# Patient Record
Sex: Male | Born: 1965 | Race: White | Hispanic: No | Marital: Single | Smoking: Current every day smoker
Health system: Southern US, Community
[De-identification: ages and names within clinical notes are randomized; demographics above are authoritative.]

## PROBLEM LIST (undated history)

## (undated) DIAGNOSIS — R42 Dizziness and giddiness: Secondary | ICD-10-CM

## (undated) DIAGNOSIS — Z8659 Personal history of other mental and behavioral disorders: Secondary | ICD-10-CM

## (undated) DIAGNOSIS — R9431 Abnormal electrocardiogram [ECG] [EKG]: Secondary | ICD-10-CM

## (undated) DIAGNOSIS — Z9189 Other specified personal risk factors, not elsewhere classified: Secondary | ICD-10-CM

## (undated) HISTORY — DX: Personal history of other mental and behavioral disorders: Z86.59

## (undated) HISTORY — DX: Other specified personal risk factors, not elsewhere classified: Z91.89

## (undated) HISTORY — DX: Dizziness and giddiness: R42

## (undated) HISTORY — DX: Abnormal electrocardiogram (ECG) (EKG): R94.31

---

## 1999-03-12 ENCOUNTER — Emergency Department (HOSPITAL_COMMUNITY): Admission: EM | Admit: 1999-03-12 | Discharge: 1999-03-12 | Payer: Self-pay

## 1999-03-17 ENCOUNTER — Emergency Department (HOSPITAL_COMMUNITY): Admission: EM | Admit: 1999-03-17 | Discharge: 1999-03-17 | Payer: Self-pay | Admitting: Emergency Medicine

## 1999-11-18 ENCOUNTER — Emergency Department (HOSPITAL_COMMUNITY): Admission: EM | Admit: 1999-11-18 | Discharge: 1999-11-18 | Payer: Self-pay

## 2000-08-29 ENCOUNTER — Emergency Department (HOSPITAL_COMMUNITY): Admission: EM | Admit: 2000-08-29 | Discharge: 2000-08-30 | Payer: Self-pay | Admitting: Emergency Medicine

## 2000-08-30 ENCOUNTER — Encounter: Payer: Self-pay | Admitting: Emergency Medicine

## 2000-10-06 ENCOUNTER — Encounter: Payer: Self-pay | Admitting: Orthopedic Surgery

## 2000-10-06 ENCOUNTER — Ambulatory Visit (HOSPITAL_COMMUNITY): Admission: RE | Admit: 2000-10-06 | Discharge: 2000-10-06 | Payer: Self-pay | Admitting: Orthopedic Surgery

## 2000-11-30 ENCOUNTER — Encounter: Admission: RE | Admit: 2000-11-30 | Discharge: 2000-11-30 | Payer: Self-pay | Admitting: Orthopedic Surgery

## 2001-07-30 ENCOUNTER — Emergency Department (HOSPITAL_COMMUNITY): Admission: EM | Admit: 2001-07-30 | Discharge: 2001-07-30 | Payer: Self-pay | Admitting: Emergency Medicine

## 2002-06-23 ENCOUNTER — Emergency Department (HOSPITAL_COMMUNITY): Admission: EM | Admit: 2002-06-23 | Discharge: 2002-06-23 | Payer: Self-pay | Admitting: Emergency Medicine

## 2004-08-19 ENCOUNTER — Emergency Department (HOSPITAL_COMMUNITY): Admission: EM | Admit: 2004-08-19 | Discharge: 2004-08-19 | Payer: Self-pay | Admitting: Emergency Medicine

## 2004-08-20 ENCOUNTER — Emergency Department (HOSPITAL_COMMUNITY): Admission: EM | Admit: 2004-08-20 | Discharge: 2004-08-20 | Payer: Self-pay | Admitting: Emergency Medicine

## 2004-08-26 ENCOUNTER — Emergency Department (HOSPITAL_COMMUNITY): Admission: EM | Admit: 2004-08-26 | Discharge: 2004-08-26 | Payer: Self-pay | Admitting: Emergency Medicine

## 2004-09-03 ENCOUNTER — Ambulatory Visit: Payer: Self-pay | Admitting: Internal Medicine

## 2004-09-08 ENCOUNTER — Ambulatory Visit: Payer: Self-pay | Admitting: Internal Medicine

## 2004-09-08 DIAGNOSIS — Z9189 Other specified personal risk factors, not elsewhere classified: Secondary | ICD-10-CM

## 2004-10-07 ENCOUNTER — Ambulatory Visit: Payer: Self-pay | Admitting: Internal Medicine

## 2004-10-10 ENCOUNTER — Encounter: Payer: Self-pay | Admitting: Internal Medicine

## 2004-10-10 LAB — CONVERTED CEMR LAB
ALT: 18 units/L
BUN: 6 mg/dL
CO2: 31 meq/L
Cholesterol: 177 mg/dL
Creatinine, Ser: 1 mg/dL
Glucose, Bld: 76 mg/dL
Hemoglobin: 15.9 g/dL
MCV: 94.7 fL
Potassium: 3.7 meq/L
RBC: 4.98 M/uL
RDW: 12.5 %
Sodium: 142 meq/L
TSH: 1.02 microintl units/mL
Total Bilirubin: 0.8 mg/dL
WBC: 6.3 10*3/uL

## 2005-03-25 ENCOUNTER — Ambulatory Visit: Payer: Self-pay | Admitting: Internal Medicine

## 2005-03-31 ENCOUNTER — Ambulatory Visit: Payer: Self-pay

## 2005-04-14 ENCOUNTER — Ambulatory Visit: Payer: Self-pay | Admitting: Internal Medicine

## 2005-06-15 ENCOUNTER — Ambulatory Visit: Payer: Self-pay | Admitting: Internal Medicine

## 2005-06-15 ENCOUNTER — Ambulatory Visit (HOSPITAL_COMMUNITY): Admission: RE | Admit: 2005-06-15 | Discharge: 2005-06-15 | Payer: Self-pay | Admitting: Internal Medicine

## 2005-07-21 ENCOUNTER — Ambulatory Visit: Payer: Self-pay | Admitting: Internal Medicine

## 2005-08-20 ENCOUNTER — Ambulatory Visit (HOSPITAL_COMMUNITY): Admission: RE | Admit: 2005-08-20 | Discharge: 2005-08-20 | Payer: Self-pay | Admitting: Internal Medicine

## 2005-08-20 ENCOUNTER — Ambulatory Visit: Payer: Self-pay | Admitting: Internal Medicine

## 2005-11-11 ENCOUNTER — Ambulatory Visit: Payer: Self-pay | Admitting: Internal Medicine

## 2005-12-19 ENCOUNTER — Ambulatory Visit: Payer: Self-pay | Admitting: Internal Medicine

## 2005-12-19 DIAGNOSIS — R42 Dizziness and giddiness: Secondary | ICD-10-CM

## 2005-12-21 ENCOUNTER — Ambulatory Visit: Payer: Self-pay | Admitting: Internal Medicine

## 2006-01-05 ENCOUNTER — Ambulatory Visit: Payer: Self-pay

## 2006-01-05 ENCOUNTER — Encounter: Payer: Self-pay | Admitting: Cardiovascular Disease

## 2006-01-06 ENCOUNTER — Ambulatory Visit: Payer: Self-pay | Admitting: Cardiology

## 2009-10-09 ENCOUNTER — Encounter: Payer: Self-pay | Admitting: Internal Medicine

## 2009-10-09 DIAGNOSIS — R9431 Abnormal electrocardiogram [ECG] [EKG]: Secondary | ICD-10-CM

## 2009-10-09 DIAGNOSIS — Z8659 Personal history of other mental and behavioral disorders: Secondary | ICD-10-CM

## 2009-10-10 ENCOUNTER — Ambulatory Visit: Payer: Self-pay | Admitting: Internal Medicine

## 2009-10-29 ENCOUNTER — Ambulatory Visit: Payer: Self-pay | Admitting: Internal Medicine

## 2010-10-26 ENCOUNTER — Encounter: Payer: Self-pay | Admitting: Internal Medicine

## 2011-06-03 ENCOUNTER — Encounter: Payer: Self-pay | Admitting: Internal Medicine

## 2011-06-03 ENCOUNTER — Ambulatory Visit (INDEPENDENT_AMBULATORY_CARE_PROVIDER_SITE_OTHER): Payer: Self-pay | Admitting: Internal Medicine

## 2011-06-03 VITALS — BP 122/74 | HR 88 | Temp 98.2°F | Ht 68.25 in | Wt 184.0 lb

## 2011-06-03 DIAGNOSIS — F8189 Other developmental disorders of scholastic skills: Secondary | ICD-10-CM

## 2011-06-03 DIAGNOSIS — F819 Developmental disorder of scholastic skills, unspecified: Secondary | ICD-10-CM

## 2011-06-03 NOTE — Progress Notes (Signed)
  Subjective:    Patient ID: Francisco Dodson, male    DOB: 1966/02/22, 45 y.o.   MRN: [id number]  HPI  Visit cancelled.  Pt requests to have disability form filled out.   Pt reports his girlfriend was handling his disability payments but she was diagnosed with cancer and living with family member.   He does not recall details of why is he receiving disability payments.  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  It has been at least 10 yrs that he has been disabled.  Pt referred back to mental health re:  Status of disability.  No charge for office visit.  Review of Systems     Objective:   Physical Exam        Assessment & Plan:

## 2012-02-05 ENCOUNTER — Ambulatory Visit (INDEPENDENT_AMBULATORY_CARE_PROVIDER_SITE_OTHER): Payer: Medicare Other | Admitting: Family Medicine

## 2012-02-05 ENCOUNTER — Ambulatory Visit: Payer: Medicare Other

## 2012-02-05 VITALS — BP 136/84 | HR 86 | Temp 97.9°F | Resp 18 | Ht 69.0 in | Wt 191.0 lb

## 2012-02-05 DIAGNOSIS — R079 Chest pain, unspecified: Secondary | ICD-10-CM

## 2012-02-05 DIAGNOSIS — R0602 Shortness of breath: Secondary | ICD-10-CM

## 2012-02-05 DIAGNOSIS — F32A Depression, unspecified: Secondary | ICD-10-CM

## 2012-02-05 DIAGNOSIS — F329 Major depressive disorder, single episode, unspecified: Secondary | ICD-10-CM

## 2012-02-05 DIAGNOSIS — Z634 Disappearance and death of family member: Secondary | ICD-10-CM

## 2012-02-05 DIAGNOSIS — F3289 Other specified depressive episodes: Secondary | ICD-10-CM

## 2012-02-05 LAB — COMPREHENSIVE METABOLIC PANEL
ALT: 29 U/L (ref 0–53)
AST: 18 U/L (ref 0–37)
Albumin: 4.1 g/dL (ref 3.5–5.2)
Alkaline Phosphatase: 45 U/L (ref 39–117)
CO2: 30 mEq/L (ref 19–32)
Glucose, Bld: 71 mg/dL (ref 70–99)
Sodium: 142 mEq/L (ref 135–145)

## 2012-02-05 LAB — TSH: TSH: 1.837 u[IU]/mL (ref 0.350–4.500)

## 2012-02-05 LAB — POCT CBC
Granulocyte percent: 55.6 %G (ref 37–80)
HCT, POC: 53 % (ref 43.5–53.7)
Hemoglobin: 17.3 g/dL (ref 14.1–18.1)
Lymph, poc: 3.4 (ref 0.6–3.4)
MCH, POC: 30.6 pg (ref 27–31.2)
MCHC: 32.6 g/dL (ref 31.8–35.4)
MCV: 93.8 fL (ref 80–97)
MID (cbc): 0.7 (ref 0–0.9)
MPV: 8.8 fL (ref 0–99.8)
POC Granulocyte: 5.2 (ref 2–6.9)
POC LYMPH PERCENT: 36.6 %L (ref 10–50)
POC MID %: 7.8 % (ref 0–12)
Platelet Count, POC: 258 10*3/uL (ref 142–424)
RBC: 5.65 M/uL (ref 4.69–6.13)
RDW, POC: 14 %
WBC: 9.3 10*3/uL (ref 4.6–10.2)

## 2012-02-05 LAB — COMPREHENSIVE METABOLIC PANEL WITH GFR
BUN: 12 mg/dL (ref 6–23)
Calcium: 9.3 mg/dL (ref 8.4–10.5)
Chloride: 106 meq/L (ref 96–112)
Creat: 0.97 mg/dL (ref 0.50–1.35)
Potassium: 4.7 meq/L (ref 3.5–5.3)
Total Bilirubin: 0.4 mg/dL (ref 0.3–1.2)
Total Protein: 6.2 g/dL (ref 6.0–8.3)

## 2012-02-05 LAB — LDL CHOLESTEROL, DIRECT: Direct LDL: 153 mg/dL — ABNORMAL HIGH

## 2012-02-05 LAB — TROPONIN I: Troponin I: <0.01 ng/mL (ref <0.06)

## 2012-02-05 NOTE — Patient Instructions (Signed)

## 2012-02-05 NOTE — Progress Notes (Signed)
[facility]:  Office Visit  Chief Complaint:  Chief Complaint  Patient presents with  . Chest Pain  . Shortness of Breath  . very stressed lately    HPI: Francisco Dodson is a 46 y.o. male who complains of:  1.  intermittent localized SOB and midsubsternal CP x 1 week, triggered by certain foods/large meals.  Lasts all day or all night. Nothing makes it feel better. Has not tried anything for it. His main issues is he feels "uncomfortable, sore, feels like indigestion". Intermittent, not triggered by activity. Likes to eat "4 fish sandwiches, fries" about 1-2 hrs before he goes to bed. He declines CP but states he has just midsternal "discomfort" after eating and drinking. Denies abdominal pain. Denies HA, vision changes, nausea, jaw pain, new shoulder or arm pain, vomiting, abdominal pain, dizziness, diaphoresis, palpitations, weakness, slurred speech, fatigue, confusion. Consumes large amounts of caffeine and smokes 1-1.5 ppd x 10 years. Denies h/o heartburn, alcohol consumption, illicit drug use. Denies history of  HTN, T2DM, Hyperlipidemia.   Risk factors for ischemia:  strong family history of MI ( father had MI at age 69, has had stents; brother had a stroke and MI, mother died from an MI at age 74); tobacco use.  2.  He is extremely stressed and depressed. Has poor support system . Tried calling his father to discuss his issues but was brushed off and felt his dad is very "cruel". He does not have an outlet to discuss his wife's recent death. His wife died from throat and rectal cancer 1 month ago ( 1 month anniversary will be in 2 days from today). He had a stress test by his internist ( Dr. Artist Pais) in 2006 which showed no ischemia ( in epic). Denies any thoughts of HI/SI/hallucinations.     History reviewed. No pertinent past medical history. History reviewed. No pertinent past surgical history. History   Social History  . Marital Status: Single    Spouse Name: N/A   Number of Children: N/A  . Years of Education: N/A   Social History Main Topics  . Smoking status: Current Everyday Smoker -- 1.0 packs/day for 10 years  . Smokeless tobacco: None  . Alcohol Use: No  . Drug Use: No  . Sexually Active: None   Other Topics Concern  . None   Social History Narrative  . None   Family History  Problem Relation Age of Onset  . Heart attack Mother   . Heart disease Mother   . Heart attack Father   . Heart disease Father   . Heart disease Brother   . Stroke Brother   . Heart attack Brother     CABG x3   Allergies  Allergen Reactions  . Penicillins    Prior to Admission medications   Not on File    ROS: The patient denies fevers, chills, night sweats, unintentional weight loss, palpitations, wheezing, dyspnea on exertion, nausea, vomiting, abdominal pain, dysuria, hematuria, melena, numbness, weakness, or tingling. + chest discomfort.  All other systems have been reviewed and were otherwise negative with the exception of those mentioned in the HPI and as above.    PHYSICAL EXAM: Filed Vitals:   02/05/12 1557  BP: 136/84  Pulse:   Temp:   Resp:   Spo2     98%  Filed Vitals:   02/05/12 1455  Height: 5\' 9"  (1.753 m)  Weight: 191 lb (86.637 kg)   Body mass index is 28.21 kg/(m^2).  General: Alert, no acute distress, slightly anxious, tangential HEENT:  Normocephalic, atraumatic, oropharynx patent. Fundoscopic exam normal, EOMI, PERRLA, Tm nl. Cardiovascular:  Regular rate and rhythm, no rubs murmurs or gallops.  No Carotid bruits, radial pulse intact. No pedal edema.  Respiratory: Clear to auscultation bilaterally.  No wheezes, rales, or rhonchi.  No cyanosis, no use of accessory musculature GI: No organomegaly, abdomen is soft and non-tender, positive bowel sounds.  No masses. Skin: No rashes. Neurologic: Facial musculature symmetric. CN 2-12 grossly normal. No slurred speech, confusion. Psychiatric: Patient is appropriate throughout  our interaction. Slightly tangential but otherwise ok.  Lymphatic: No cervical lymphadenopathy, no thyroidmegaly. Musculoskeletal: Gait intact.   LABS: Results for orders placed in visit on 02/05/12  POCT CBC      Component Value Range   WBC 9.3  4.6 - 10.2 (K/uL)   Lymph, poc 3.4  0.6 - 3.4    POC LYMPH PERCENT 36.6  10 - 50 (%L)   MID (cbc) 0.7  0 - 0.9    POC MID % 7.8  0 - 12 (%M)   POC Granulocyte 5.2  2 - 6.9    Granulocyte percent 55.6  37 - 80 (%G)   RBC 5.65  4.69 - 6.13 (M/uL)   Hemoglobin 17.3  14.1 - 18.1 (g/dL)   HCT, POC 16.1  09.6 - 53.7 (%)   MCV 93.8  80 - 97 (fL)   MCH, POC 30.6  27 - 31.2 (pg)   MCHC 32.6  31.8 - 35.4 (g/dL)   RDW, POC 04.5     Platelet Count, POC 258  142 - 424 (K/uL)   MPV 8.8  0 - 99.8 (fL)     EKG/XRAY:   Primary read interpreted by Dr. Conley Rolls at [facility]. EKG-NSR at 74 bpm, no ST-T wave  Depression/elevation CXR-no pneumonthorax, no infiltrate   ASSESSMENT/PLAN: Encounter Diagnoses  Name Primary?  . Chest pain at rest Yes  . Bereavement   . Depression (emotion)    1. CP-etiology likely reflux/esophageal spasm in origin vs cardiovascular vs stress vs less likely pulmonary. He has GERD sxs based on hx, however due to strong family h/o MI/heart disease I will refer him to cardiology to be seen next week. Will get stat troponin x 1. Call report to be sent to me. Rx trial of Prilosec 20 mg daily. Tobacco cessation and GERD precautions given. Patient advise to go to ER if sxs change/worsen.   2. Depression?Bereavement?-need to be reevaluated for depression/bereavement after cardiac evaluation. Patient recently lost his wife 1 month ago from throat and rectal cancer. He is anxious that the house he is living in  which was his wife's house and where both she and her mother died from cancer is causing him to feel ill. He states that it is also the "reason why I am feeling sick".  Recommend f/u with PCP and/or with [place] after cardiology evaluation. He  is planning to get the house inspected on Monday.   Hamilton Capri PHUONG, DO 02/05/2012 4:33 PM

## 2012-02-06 ENCOUNTER — Telehealth: Payer: Self-pay | Admitting: Family Medicine

## 2012-02-06 NOTE — Telephone Encounter (Signed)
Spoke to patietn about labs. His LDL was elevated but he had already eaten, so recommended that he get a repeat fasting lipid with cardiologistand/or [place] on next appt. He will get a referral to cardiology, if he does not hear from [place] by next week midweek then need to call us back. Patient feeling ok , CP free.

## 2012-02-12 ENCOUNTER — Encounter: Payer: Self-pay | Admitting: *Deleted

## 2012-02-15 ENCOUNTER — Institutional Professional Consult (permissible substitution): Payer: Medicare Other | Admitting: Cardiovascular Disease

## 2012-03-31 ENCOUNTER — Emergency Department (HOSPITAL_COMMUNITY)
Admission: EM | Admit: 2012-03-31 | Discharge: 2012-04-01 | Discharge status: Against Medical Advice | Payer: Medicare Other | Attending: Emergency Medicine | Admitting: Emergency Medicine

## 2012-03-31 ENCOUNTER — Encounter (HOSPITAL_COMMUNITY): Payer: Self-pay | Admitting: Emergency Medicine

## 2012-03-31 DIAGNOSIS — R21 Rash and other nonspecific skin eruption: Secondary | ICD-10-CM | POA: Insufficient documentation

## 2012-03-31 NOTE — ED Notes (Signed)
Per pt, states he picked at a bump on right leg and caused a sore, noticed he has broken out with more blisters-

## 2012-03-31 NOTE — ED Notes (Signed)
Pt. Called to be roomed.  No answer.

## 2012-04-01 ENCOUNTER — Ambulatory Visit (INDEPENDENT_AMBULATORY_CARE_PROVIDER_SITE_OTHER): Payer: Medicare Other | Admitting: Emergency Medicine

## 2012-04-01 VITALS — BP 135/76 | HR 67 | Temp 97.9°F | Resp 16 | Ht 68.0 in | Wt 186.4 lb

## 2012-04-01 DIAGNOSIS — L738 Other specified follicular disorders: Secondary | ICD-10-CM

## 2012-04-01 DIAGNOSIS — L97919 Non-pressure chronic ulcer of unspecified part of right lower leg with unspecified severity: Secondary | ICD-10-CM

## 2012-04-01 DIAGNOSIS — S81809A Unspecified open wound, unspecified lower leg, initial encounter: Secondary | ICD-10-CM

## 2012-04-01 DIAGNOSIS — L739 Follicular disorder, unspecified: Secondary | ICD-10-CM

## 2012-04-01 DIAGNOSIS — L97909 Non-pressure chronic ulcer of unspecified part of unspecified lower leg with unspecified severity: Secondary | ICD-10-CM

## 2012-04-01 MED ORDER — MUPIROCIN CALCIUM 2 % NA OINT: TOPICAL_OINTMENT | Freq: Two times a day (BID) | NASAL | Status: DC

## 2012-04-01 MED ORDER — DOXYCYCLINE HYCLATE 100 MG PO CAPS: 100.0000 mg | ORAL_CAPSULE | Freq: Two times a day (BID) | ORAL | Status: AC

## 2012-04-01 MED ORDER — MUPIROCIN CALCIUM 2 % EX CREA: TOPICAL_CREAM | Freq: Three times a day (TID) | CUTANEOUS | Status: AC

## 2012-04-01 NOTE — Patient Instructions (Addendum)
Hospice [phone] main number, they will transfer you to correct person. Clean leg with soap and water three times a day and apply cream to the ulcer. Take your antibiotics twice a day.

## 2012-04-01 NOTE — Progress Notes (Signed)
  Subjective:    Patient ID: Francisco Dodson, male    DOB: [birth year], 46 y.o.   MRN: [id number]  HPI patient enters with an ulcer on his proximal right thigh as well as multiple raised red follicular areas on his right upper thigh. He initially developed a boil like area approximately one week ago.  He tried to rub this area and clean it and developed and ulcer.  He then developed multiple pipple like area on his proxmial thigh    Review of SystemsPatient is griving over the loss of he's wife two months ago.  He is talking with he's pastor but has no one else to speak with.   Objective:   Physical Exam there is a 1.5 x 1.5 cm ulcerated area proximal right thigh. There are multiple areas of folliculitis over the anterior thigh        Assessment & Plan:  The initial area on his proximal thigh could have been a spider bite which became infected. He now signs of secondary folliculitis below the initial lesion. He will keep this area clean with soap and water apply Bactroban and take doxycycline twice a day . Recheck 1 week .

## 2012-04-04 LAB — WOUND CULTURE: Gram Stain: NONE SEEN

## 2012-04-06 ENCOUNTER — Ambulatory Visit (INDEPENDENT_AMBULATORY_CARE_PROVIDER_SITE_OTHER): Payer: Medicare Other | Admitting: Emergency Medicine

## 2012-04-06 VITALS — BP 114/78 | HR 86 | Temp 98.0°F | Resp 16 | Ht 68.0 in | Wt 181.4 lb

## 2012-04-06 DIAGNOSIS — L739 Follicular disorder, unspecified: Secondary | ICD-10-CM

## 2012-04-06 DIAGNOSIS — L738 Other specified follicular disorders: Secondary | ICD-10-CM

## 2012-04-06 DIAGNOSIS — R0602 Shortness of breath: Secondary | ICD-10-CM

## 2012-04-06 NOTE — Progress Notes (Signed)
  Subjective:    Patient ID: Francisco Dodson, male    DOB: Aug 07, 1966, 46 y.o.   MRN: [id number]  HPI he states he intermittently gets short of breath and stressed. He denies any true chest pain. He states he went to a thorough cardiac workup recently and it was unremarkable. patient presents for followup he just got his Cipro filled yesterday. He is currently taking it twice a day. He did not get his doxycycline filled to he has had progressive increase in the redness and swelling of these areas of folliculitis on his right leg. His cultures growing and has as a he's currently on Cipro 500 twice a day.    Review of Systems     Objective:   Physical Exam there are multiple areas of folliculitis involving the anterior right thigh. His chest is clear to auscultation his cardiac exam is unremarkable his abdomen is soft and nontender to exam. He does not have any inguinal nodes palpable  EKG normal sinus     Assessment & Plan:  Patient is taking his medication now. He did grown MSSA which is sensitive to Cipro . I did an EKG today because of his family history heart disease and history of shortness of breath and is normal. He states he has had cardiac workup and will bring that on his next visit he is to clean his leg with soap and water and Hibiclens 2 times a day and to take his Cipro twice a day.

## 2012-04-06 NOTE — Progress Notes (Signed)
  Subjective:    Patient ID: Francisco Dodson, male    DOB: 1966/09/09, 46 y.o.   MRN: [id number]  HPI  46 year old White male is here today as a follow up for ulcers on his right thigh.  Pt states that the ulcers have spread and are more painful.      Review of Systems     Objective:   Physical Exam        Assessment & Plan:

## 2012-08-29 ENCOUNTER — Ambulatory Visit (INDEPENDENT_AMBULATORY_CARE_PROVIDER_SITE_OTHER): Payer: Medicare Other | Admitting: Family Medicine

## 2012-08-29 VITALS — BP 129/84 | HR 80 | Temp 98.0°F | Resp 17 | Ht 69.0 in | Wt 174.0 lb

## 2012-08-29 DIAGNOSIS — L02519 Cutaneous abscess of unspecified hand: Secondary | ICD-10-CM

## 2012-08-29 DIAGNOSIS — M65049 Abscess of tendon sheath, unspecified hand: Secondary | ICD-10-CM

## 2012-08-29 DIAGNOSIS — L03119 Cellulitis of unspecified part of limb: Secondary | ICD-10-CM

## 2012-08-29 DIAGNOSIS — F411 Generalized anxiety disorder: Secondary | ICD-10-CM

## 2012-08-29 MED ORDER — CEFTRIAXONE SODIUM 1 G IJ SOLR
1.0000 g | INTRAMUSCULAR | Status: DC
  Administered 2012-08-29: 1 g via INTRAMUSCULAR

## 2012-08-29 MED ORDER — SULFAMETHOXAZOLE-TRIMETHOPRIM 800-160 MG PO TABS: 1.0000 | ORAL_TABLET | Freq: Two times a day (BID) | ORAL | Status: DC

## 2012-08-29 NOTE — Patient Instructions (Addendum)
Return tomorrow morning to get this rechecked as directed.  Take any antibiotic pill near bedtime tonight, and beginning tomorrow take one every morning and evening.  If the weather is really bad tomorrow, call the office and speak to AMY

## 2012-08-29 NOTE — Progress Notes (Signed)
  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 He has a abscess on the back of his left hand is come up pretty quickly. It had a little hole drainage of pus. Very anxious and paranoid of needles.   Objective: Abscess dorsum of left hand with a large area of erythema. Very tender along the extensor tendon of the left third finger.  Assessment: Abscess left hand Anxiety  Plan: I&D   After seeing the pus draining there. I believe this is extending along the tendon sheath. Will treat with Rocephin and begin on oral Bactrim.

## 2012-08-29 NOTE — Progress Notes (Signed)
Procedure:  Consent obtained.  Local anesthesia with 1% lido.  Betadine prep.  #11 blade used to make a 1/2 cm incision from scab medially to area of most fluctuance.  Purulence discharge expressed from wound.  Infection seems to track proximally along the 3rd extensor tendon where pt has considerable TTP about 1.5in from the scab site.  Erythematous area marked with skin marker.  Drsg placed.

## 2012-08-30 ENCOUNTER — Ambulatory Visit (INDEPENDENT_AMBULATORY_CARE_PROVIDER_SITE_OTHER): Payer: Medicare Other | Admitting: Family Medicine

## 2012-08-30 ENCOUNTER — Telehealth: Payer: Self-pay

## 2012-08-30 VITALS — BP 127/73 | HR 93 | Temp 98.1°F | Resp 16 | Ht 69.0 in | Wt 174.0 lb

## 2012-08-30 DIAGNOSIS — M79643 Pain in unspecified hand: Secondary | ICD-10-CM

## 2012-08-30 DIAGNOSIS — M79609 Pain in unspecified limb: Secondary | ICD-10-CM

## 2012-08-30 DIAGNOSIS — L03114 Cellulitis of left upper limb: Secondary | ICD-10-CM

## 2012-08-30 LAB — POCT CBC
Hemoglobin: 17 g/dL (ref 14.1–18.1)
Lymph, poc: 2.8 (ref 0.6–3.4)
MCH, POC: 31.1 pg (ref 27–31.2)
MCHC: 31.5 g/dL — AB (ref 31.8–35.4)
MPV: 9.6 fL (ref 0–99.8)
POC Granulocyte: 9.4 — AB (ref 2–6.9)
POC LYMPH PERCENT: 21.6 %L (ref 10–50)
POC MID %: 6.6 %M (ref 0–12)
RDW, POC: 14 %
WBC: 13.1 10*3/uL — AB (ref 4.6–10.2)

## 2012-08-30 NOTE — Telephone Encounter (Signed)
PT WAS SEEN BY DR HOPPER FOR MRSA ON HIS HANDS AND HE IS NOT IN ANY PAIN AND THE RED CIRCLE DRAWN ON HIM, IT HASN'T GONE OUT OF THE LINE, HE WOULD LIKE A CALL BACK AT [phone]

## 2012-08-30 NOTE — Patient Instructions (Signed)
Continue the antibiotics that were prescribed by Dr. Alwyn Ren.  If any worsening in symptoms overnight (redness, pain, stiffness or pain with moving finger), go to the emergency room or come to our office as soon as we open at 8am. If your hand is stable or improving - recheck with Dr. Alwyn Ren tomorrow at 2 pm.

## 2012-08-30 NOTE — Progress Notes (Signed)
Subjective:    Patient ID: Francisco Dodson, male    DOB: 11-09-1965, 46 y.o.   MRN: [id number]  HPI Francisco Dodson is a 46 y.o. male Seen yesterday by Dr Alwyn Ren for L dorsal hand infection.  Had been swollen for 3 days prior.   Here for follow up. s/p I/D of dorsal abcess and wound cx obtained yesterday. Procedure:  #11 blade used to make a 1/2 cm incision from scab medially to area of most fluctuance. Purulence discharge expressed from wound. Infection seems to track proximally along the 3rd extensor tendon where pt has considerable TTP about 1.5in from the scab site. Erythematous area marked with skin marker. Drsg placed.  Rocephin 1 gram injection, and started on Septra DS BID - s/p 2 doses.  Feels better today.  Less soreness, can move fingers better.  Less swollen today. Still puffy. No fever.  No new side effects with meds. No pain meds needed.    Review of Systems  Constitutional: Negative for fever and chills.  Skin: Positive for rash.       Objective:   Physical Exam  Constitutional: He is oriented to person, place, and time. He appears well-developed and well-nourished.       Nontoxic.  Pulmonary/Chest: Effort normal.  Musculoskeletal:       Hands: Neurological: He is alert and oriented to person, place, and time.  Skin:       See photo.  Erythema to dorsum of hand, with central area of scab, small amount of serosanguinous drainage.     Results for orders placed in visit on 08/30/12  POCT CBC      Component Value Range   WBC 13.1 (*) 4.6 - 10.2 K/uL   Lymph, poc 2.8  0.6 - 3.4   POC LYMPH PERCENT 21.6  10 - 50 %L   MID (cbc) 0.9  0 - 0.9   POC MID % 6.6  0 - 12 %M   POC Granulocyte 9.4 (*) 2 - 6.9   Granulocyte percent 71.8  37 - 80 %G   RBC 5.47  4.69 - 6.13 M/uL   Hemoglobin 17.0  14.1 - 18.1 g/dL   HCT, POC 82.9 (*) 56.2 - 53.7 %   MCV 98.7 (*) 80 - 97 fL   MCH, POC 31.1  27 - 31.2 pg   MCHC 31.5 (*) 31.8 - 35.4 g/dL   RDW, POC 13.0     Platelet Count, POC 260  142  - 424 K/uL   MPV 9.6  0 - 99.8 fL       Assessment & Plan:  [name] is a 46 y.o. male 1. Cellulitis of hand, left  POCT CBC  2. Pain, hand      L hand cellulitis with concern of 3rd tendon sheath involvement. Improved symptomatically and with rom since yesterday by report.  Although this is encouraging - discussed with patient this may be due in part to Rocephin yesterday, as only 2 doses of Septra at this time. Recommended repeat of Rocephin tonight and recheck tomorrow,  as still possible need for hand surgeon eval if this does not continue to improve. Marland Kitchen He refused repeat Rocephin.  Discussed risks of not treating with this, including but not limited to progression of infection to sheath of tendon, leading to surgery or possible further damage to tissue of arm.  Understanding expressed.  See below.  Patient Instructions  Continue the antibiotics that were prescribed by Dr. Alwyn Ren.  If any worsening  in symptoms overnight (redness, pain, stiffness or pain with moving finger), go to the emergency room or come to our office as soon as we open at 8am. If your hand is stable or improving - recheck with Dr. Alwyn Ren tomorrow at 2 pm.

## 2012-08-30 NOTE — Telephone Encounter (Signed)
Called pt he needs to come in, I told him. He is coming this evening.

## 2012-08-30 NOTE — Telephone Encounter (Signed)
PT CALLED AGAIN, GAVE THE WRONG PHONE NUMBER.  PLEASE CALL HIM AT [phone]

## 2012-09-01 LAB — WOUND CULTURE

## 2015-10-06 ENCOUNTER — Emergency Department (HOSPITAL_COMMUNITY): Payer: Medicare Other

## 2015-10-06 ENCOUNTER — Emergency Department (HOSPITAL_COMMUNITY)
Admission: EM | Admit: 2015-10-06 | Discharge: 2015-10-07 | Disposition: A | Discharge status: Home / Self Care | Payer: Medicare Other | Attending: Emergency Medicine | Admitting: Emergency Medicine

## 2015-10-06 ENCOUNTER — Encounter (HOSPITAL_COMMUNITY): Payer: Self-pay | Admitting: *Deleted

## 2015-10-06 DIAGNOSIS — Z880 Allergy status to penicillin: Secondary | ICD-10-CM | POA: Diagnosis not present

## 2015-10-06 DIAGNOSIS — R42 Dizziness and giddiness: Secondary | ICD-10-CM | POA: Insufficient documentation

## 2015-10-06 DIAGNOSIS — F17210 Nicotine dependence, cigarettes, uncomplicated: Secondary | ICD-10-CM | POA: Diagnosis not present

## 2015-10-06 DIAGNOSIS — Z792 Long term (current) use of antibiotics: Secondary | ICD-10-CM | POA: Diagnosis not present

## 2015-10-06 DIAGNOSIS — R600 Localized edema: Secondary | ICD-10-CM | POA: Insufficient documentation

## 2015-10-06 DIAGNOSIS — R079 Chest pain, unspecified: Secondary | ICD-10-CM | POA: Insufficient documentation

## 2015-10-06 DIAGNOSIS — M79602 Pain in left arm: Secondary | ICD-10-CM | POA: Diagnosis not present

## 2015-10-06 DIAGNOSIS — M791 Myalgia: Secondary | ICD-10-CM | POA: Diagnosis not present

## 2015-10-06 LAB — CBC
HCT: 47.9 % (ref 39.0–52.0)
Hemoglobin: 16.6 g/dL (ref 13.0–17.0)
MCH: 33 pg (ref 26.0–34.0)
MCHC: 34.7 g/dL (ref 30.0–36.0)
MCV: 95.2 fL (ref 78.0–100.0)
PLATELETS: 269 10*3/uL (ref 150–400)
RBC: 5.03 MIL/uL (ref 4.22–5.81)
RDW: 13.7 % (ref 11.5–15.5)
WBC: 11.8 10*3/uL — ABNORMAL HIGH (ref 4.0–10.5)

## 2015-10-06 LAB — BASIC METABOLIC PANEL
Anion gap: 6 (ref 5–15)
BUN: 8 mg/dL (ref 6–20)
CHLORIDE: 103 mmol/L (ref 101–111)
CO2: 30 mmol/L (ref 22–32)
CREATININE: 1.07 mg/dL (ref 0.61–1.24)
Calcium: 9.2 mg/dL (ref 8.9–10.3)
GFR calc Af Amer: >60 mL/min (ref >60)
GFR calc non Af Amer: >60 mL/min (ref >60)
GLUCOSE: 111 mg/dL — AB (ref 65–99)
Potassium: 3.1 mmol/L — ABNORMAL LOW (ref 3.5–5.1)
Sodium: 139 mmol/L (ref 135–145)

## 2015-10-06 LAB — TROPONIN I: Troponin I: <0.03 ng/mL (ref <0.031)

## 2015-10-06 MED ORDER — ASPIRIN 81 MG PO CHEW
324.0000 mg | CHEWABLE_TABLET | Freq: Once | ORAL | Status: AC
  Administered 2015-10-06: 324 mg via ORAL
  Filled 2015-10-06: qty 4

## 2015-10-06 MED ORDER — POTASSIUM CHLORIDE CRYS ER 20 MEQ PO TBCR
40.0000 meq | EXTENDED_RELEASE_TABLET | Freq: Once | ORAL | Status: AC
Start: 2015-10-06 — End: 2015-10-06
  Administered 2015-10-06: 40 meq via ORAL
  Filled 2015-10-06: qty 2

## 2015-10-06 NOTE — ED Provider Notes (Signed)
CSN: [id number]     Arrival date & time 10/06/15  2138 History  By signing my name below, I, Francisco Dodson, attest that this documentation has been prepared under the direction and in the presence of Francisco Rhine, MD. Electronically Signed: [name], ED Scribe. 10/06/2015. 11:36 PM.    Chief Complaint  Patient presents with  . Chest Pain   Patient is a 50 y.o. male presenting with chest pain. The history is provided by the patient. No language interpreter was used.  Chest Pain Pain location:  L chest Pain quality: aching and tightness   Pain radiates to:  Does not radiate Pain radiates to the back: no   Pain severity:  Mild Onset quality:  Gradual Duration:  2 days Timing:  Intermittent Progression:  Resolved Chronicity:  Recurrent Context: lifting and stress   Relieved by:  Rest Worsened by:  Exertion Associated symptoms: no diaphoresis, no nausea, not vomiting and no weakness   Risk factors: male sex and smoking   Risk factors: no coronary artery disease, no diabetes mellitus, no high cholesterol, no hypertension and no prior DVT/PE   Risk factors comment:  Family h/o CAD  HPI Comments: Francisco Dodson is a 50 y.o. male smoker at 1 ppd who presents to the Emergency Department complaining of intermittent, left-sided chest soreness and tension onset 2 days ago. He notes he is not currently in pain and has not had any chest pain at all today, but came to the ED after his girlfriend insisted he be seen by someone. He reports associated left arm pain in the muscles above and below the elbow joint, but notes that this does not currently hurt, either.  The chest pain does not radiate to arm.  He states that the both pains typically only occur if he has done heavy lifting. He states he also feels some dyspnea on exertion and has to take small breaks every once in a while, but only when he is heavily working for extended amounts of time. He notes he has been doing a large amount of heavy  lifting all week and saw his PCP for arm numbness and mild chest wall pain. He states he had a chest XR done at that time and was told to lift less and cut back on smoking. He notes he also currently has a Stye on the left eye lid with associated swelling to the eye lid, which is why his girlfriend was concerned he may have a facial droop. He denies a PMHx of MI, heart disease, DVT, and PE.He does note a FHx of heart disease (father). Pt denies diaphoresis, n/v, lightheadedness, and weakness.   Pt is allergic to penicillins.  Past Medical History  Diagnosis Date  . DIZZINESS   . ELECTROCARDIOGRAM, ABNORMAL   . ATTENTION DEFICIT DISORDER, HX OF   . CHEST PAIN, ATYPICAL, HX OF    History reviewed. No pertinent past surgical history. Family History  Problem Relation Age of Onset  . Heart attack Mother   . Heart disease Mother   . Heart attack Father   . Heart disease Father   . Heart disease Brother   . Stroke Brother   . Heart attack Brother     CABG x3   Social History  Substance Use Topics  . Smoking status: Current Every Day Smoker -- 1.00 packs/day for 10 years    Types: Cigarettes  . Smokeless tobacco: None  . Alcohol Use: No    Review of Systems  Constitutional:  Negative for diaphoresis.  HENT: Positive for facial swelling.   Cardiovascular: Positive for chest pain.  Gastrointestinal: Negative for nausea and vomiting.  Musculoskeletal: Positive for myalgias.  Neurological: Negative for weakness and light-headedness.  All other systems reviewed and are negative.   Allergies  Penicillins  Home Medications   Prior to Admission medications   Medication Sig Start Date End Date Taking? Authorizing Provider  sulfamethoxazole-trimethoprim (BACTRIM DS,SEPTRA DS) 800-160 MG per tablet Take 1 tablet by mouth 2 (two) times daily. 08/29/12   Peyton Najjar, MD   BP 146/89 mmHg  Pulse 83  Temp(Src) 98.1 F (36.7 C) (Oral)  Resp 19  Ht 5\' 11"  (1.803 m)  Wt 180 lb (81.647  kg)  BMI 25.12 kg/m2  SpO2 100% Physical Exam CONSTITUTIONAL: Well developed/well nourished HEAD: Normocephalic/atraumatic EYES: EOMI/PERRL, stye to L eyelid ENMT: Mucous membranes moist NECK: supple no meningeal signs SPINE/BACK:entire spine nontender CV: S1/S2 noted, no murmurs/rubs/gallops noted, no chest wall TTP LUNGS: Lungs are clear to auscultation bilaterally, no apparent distress ABDOMEN: soft, nontender, no rebound or guarding, bowel sounds noted throughout abdomen GU:no cva tenderness NEURO: Pt is awake/alert/appropriate, moves all extremitiesx4.  No facial droop.  He is watching TV and in no distress EXTREMITIES: pulses normal/equal, full ROM, no TTP or deformity to L arm, no calf TTP noted SKIN: warm, color normal PSYCH: no abnormalities of mood noted, alert and oriented to situation  ED Course  Procedures  DIAGNOSTIC STUDIES: Oxygen Saturation is 100% on RA, normal by my interpretation.    COORDINATION OF CARE: 11:31 PM - Discussed low potassium results. Discussed plans to observe pt and continue diagnostic studies to r/o heart disease. Will call pt's girlfriend to give her an update on pt's condition.Pt advised of plan for treatment and pt agrees.  2:00 AM HEART score 3 and he has two negative troponins.  He actually denies any CP today He appears PERC negative He thinks pain is soreness from lifting, rather than exertional CP, but it is unclear from his history At this point, I feel he is safe for d/c home but should limit exertion over next week and will need PCP and cardiology followup He wanted me to call his girlfriend to give her an update but multiple attempts to call unanswered   Labs Review Labs Reviewed  BASIC METABOLIC PANEL - Abnormal; Notable for the following:    Potassium 3.1 (*)    Glucose, Bld 111 (*)    All other components within normal limits  CBC - Abnormal; Notable for the following:    WBC 11.8 (*)    All other components within normal  limits  TROPONIN I  TROPONIN I    Imaging Review Dg Chest 2 View  10/06/2015  CLINICAL DATA:  Left-sided chest pain. Pain radiates to left arm. Dizziness. Symptoms for 2 days. EXAM: CHEST  2 VIEW COMPARISON:  02/05/2012 FINDINGS: The cardiomediastinal contours are normal. The lungs are clear. Pulmonary vasculature is normal. No consolidation, pleural effusion, or pneumothorax. No acute osseous abnormalities are seen. IMPRESSION: No acute pulmonary process. Electronically Signed   By: Rubye Oaks M.D.   On: 10/06/2015 23:03   I have personally reviewed and evaluated these images and lab results as part of my medical decision-making.   EKG Interpretation   Date/Time:  Sunday October 06 2015 21:52:19 EST Ventricular Rate:  85 PR Interval:  159 QRS Duration: 110 QT Interval:  380 QTC Calculation: 452 R Axis:   40 Text Interpretation:  Sinus  rhythm Probable left atrial enlargement Low  voltage, precordial leads Baseline wander in lead(s) II No significant  change since last tracing Confirmed by Bebe ShaggyWICKLINE  MD, Dorinda HillNALD ([id number]) on  10/06/2015 11:15:03 PM     Medications  aspirin chewable tablet 324 mg (324 mg Oral Given 10/06/15 2347)  potassium chloride SA (K-DUR,KLOR-CON) CR tablet 40 mEq (40 mEq Oral Given 10/06/15 2347)    MDM   Final diagnoses:  Chest pain, unspecified chest pain type    Nursing notes including past medical history and social history reviewed and considered in documentation xrays/imaging reviewed by myself and considered during evaluation Labs/vital reviewed myself and considered during evaluation    I personally performed the services described in this documentation, which was scribed in my presence. The recorded information has been reviewed and is accurate.       Francisco Rhineonald Kary Colaizzi, MD 10/07/15 [phone]

## 2015-10-06 NOTE — ED Notes (Signed)
Pt reports that he has been having left side chest tingling that radiates to left arm, dizziness since Friday, made the decision to come to er tonight but sat in waiting room and did not check in for two hours, pt had a friend that he had notified that he was in the er and waiting on test results, friend came to registration asking about the test, pt decided to check in at that point and time,

## 2015-10-07 LAB — TROPONIN I: Troponin I: <0.03 ng/mL (ref <0.031)

## 2015-10-07 NOTE — ED Notes (Signed)
Pt coming to desk, states he needs to go to car to get his charger for his phone; this nurse informed pt that the EDP would be in the room to go over results, but pt insisted on going to get his phone charger; pt redirected back to room to remove IV and pt seen walking out of the department

## 2015-10-07 NOTE — Discharge Instructions (Signed)

## 2016-01-22 IMAGING — DX DG CHEST 2V
2 series · 2 of 2 positions shown · non-contrast
Comparison: 02/05/2012

CLINICAL DATA: Left-sided chest pain. Pain radiates to left arm.
Dizziness. Symptoms for 2 days.

EXAM:
CHEST  2 VIEW

[chest pa]
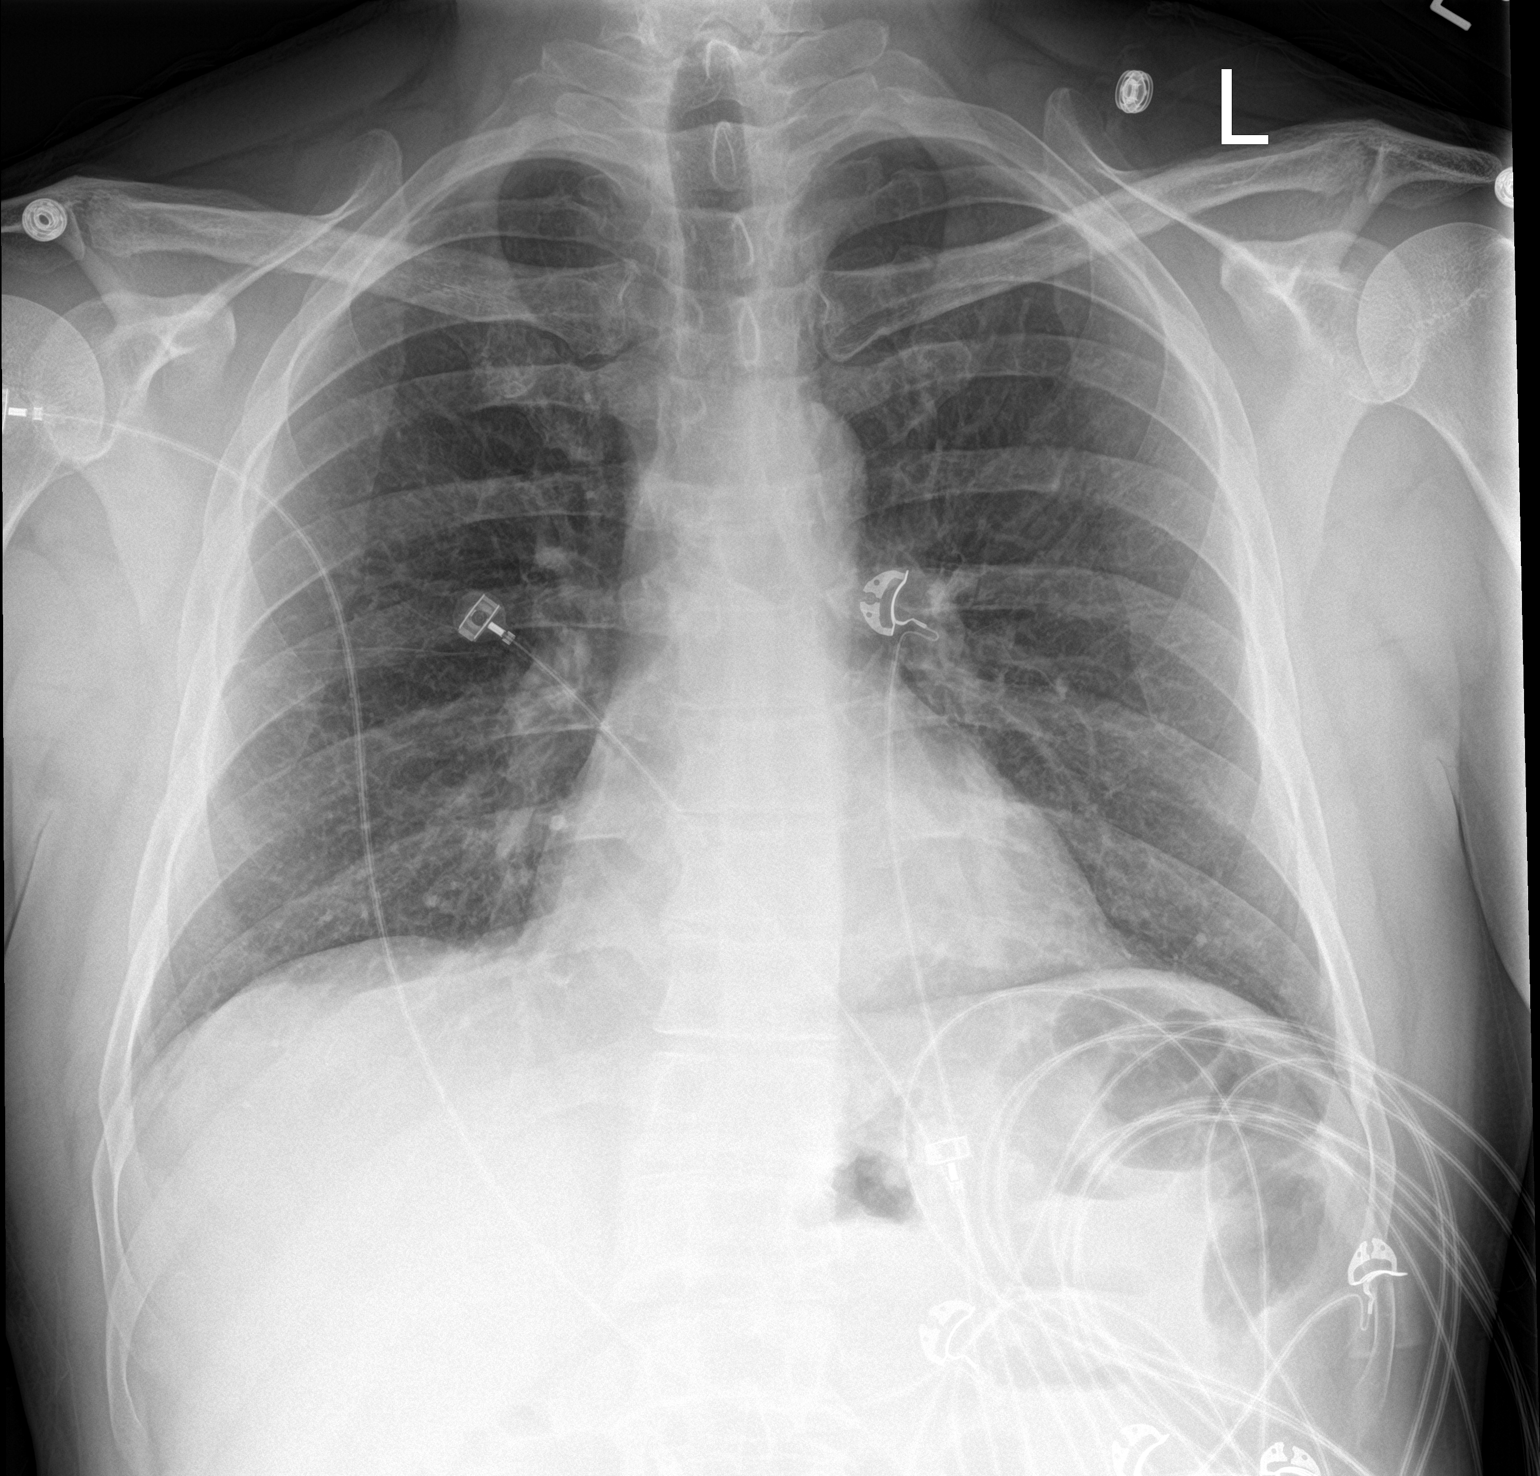

[chest lat]
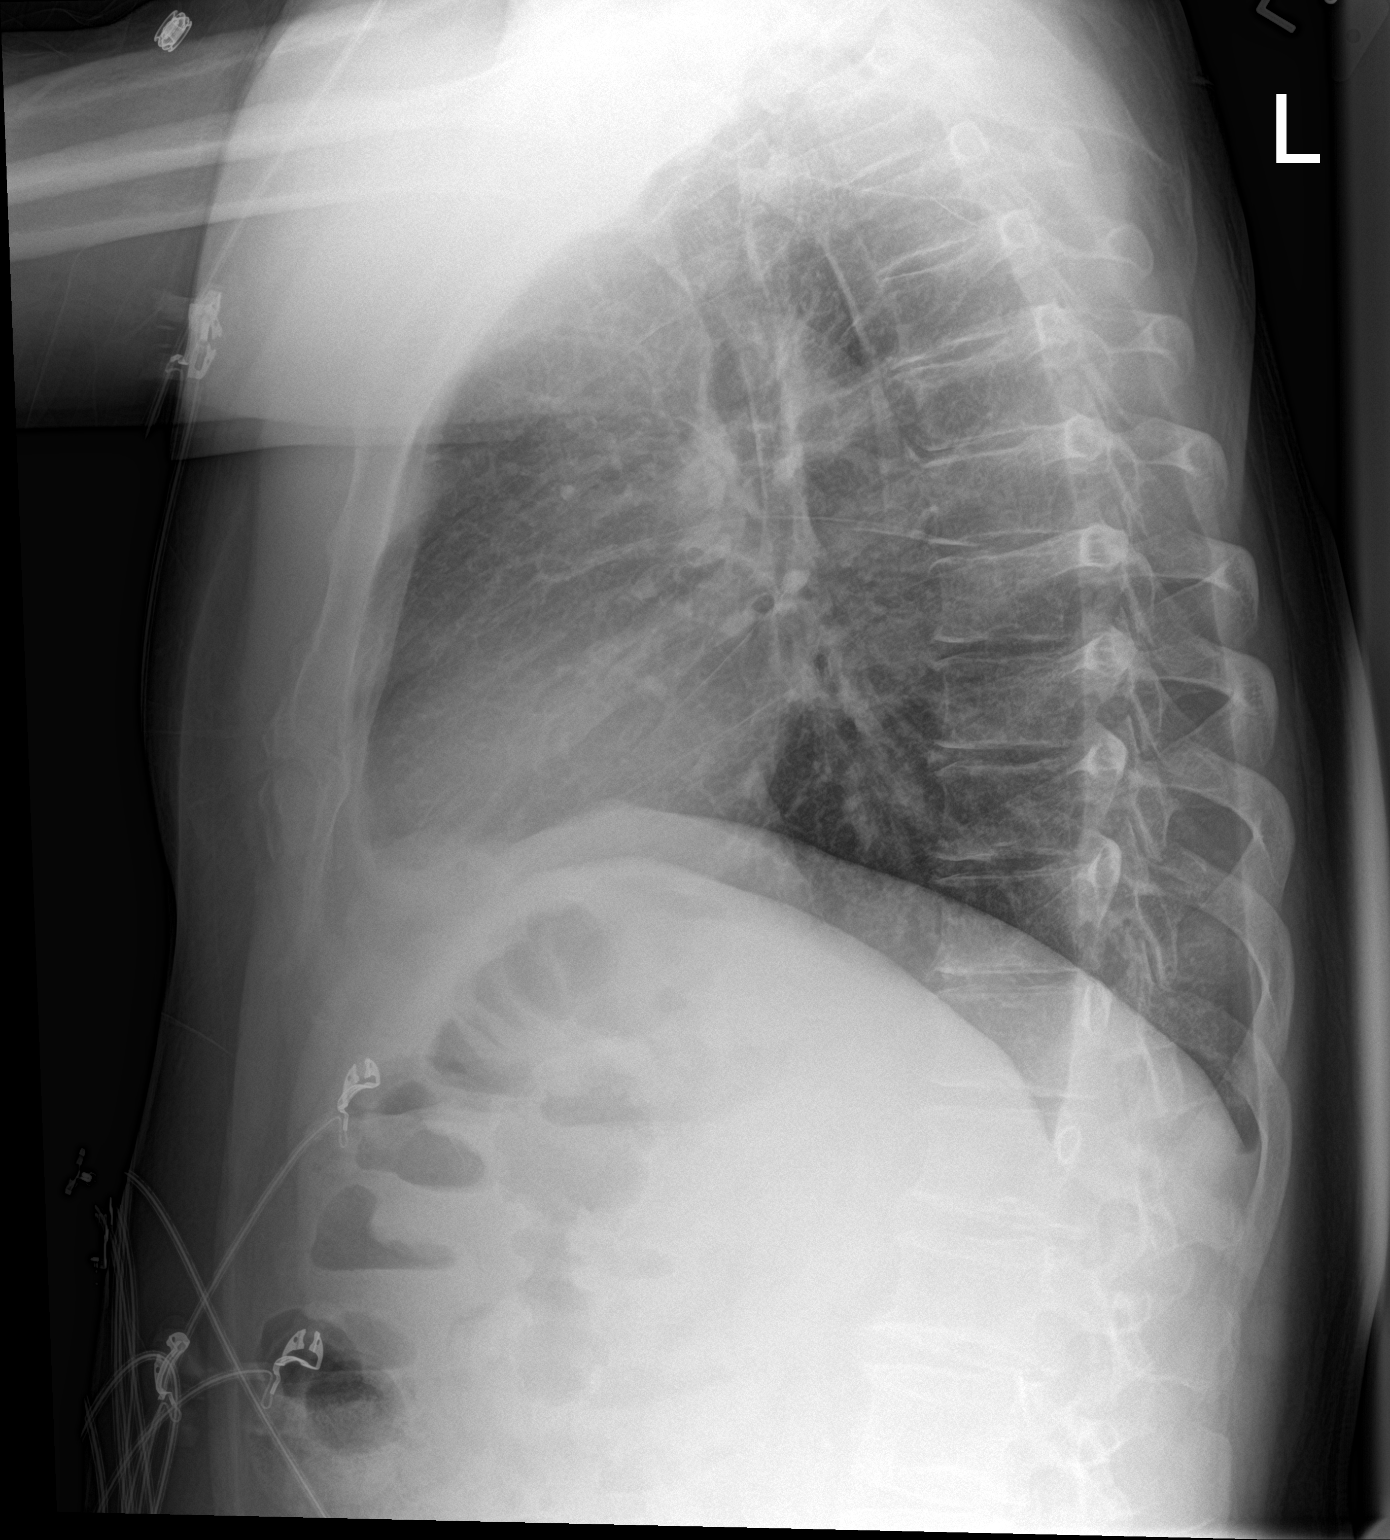

[2 of 2 positions shown; findings below may reference images not displayed]

FINDINGS: The cardiomediastinal contours are normal. The lungs are clear.
Pulmonary vasculature is normal. No consolidation, pleural effusion,
or pneumothorax. No acute osseous abnormalities are seen.
IMPRESSION: No acute pulmonary process.

## 2016-01-25 ENCOUNTER — Emergency Department (HOSPITAL_COMMUNITY)
Admission: EM | Admit: 2016-01-25 | Discharge: 2016-01-25 | Disposition: A | Discharge status: Home / Self Care | Payer: Medicare Other | Attending: Emergency Medicine | Admitting: Emergency Medicine

## 2016-01-25 ENCOUNTER — Emergency Department (HOSPITAL_COMMUNITY): Payer: Medicare Other

## 2016-01-25 ENCOUNTER — Encounter (HOSPITAL_COMMUNITY): Payer: Self-pay | Admitting: Emergency Medicine

## 2016-01-25 DIAGNOSIS — S0990XA Unspecified injury of head, initial encounter: Secondary | ICD-10-CM | POA: Diagnosis present

## 2016-01-25 DIAGNOSIS — Y939 Activity, unspecified: Secondary | ICD-10-CM | POA: Insufficient documentation

## 2016-01-25 DIAGNOSIS — W228XXA Striking against or struck by other objects, initial encounter: Secondary | ICD-10-CM | POA: Insufficient documentation

## 2016-01-25 DIAGNOSIS — W1809XA Striking against other object with subsequent fall, initial encounter: Secondary | ICD-10-CM

## 2016-01-25 DIAGNOSIS — Y929 Unspecified place or not applicable: Secondary | ICD-10-CM | POA: Insufficient documentation

## 2016-01-25 DIAGNOSIS — S0101XA Laceration without foreign body of scalp, initial encounter: Secondary | ICD-10-CM | POA: Diagnosis not present

## 2016-01-25 DIAGNOSIS — F17210 Nicotine dependence, cigarettes, uncomplicated: Secondary | ICD-10-CM | POA: Diagnosis not present

## 2016-01-25 DIAGNOSIS — Y999 Unspecified external cause status: Secondary | ICD-10-CM | POA: Insufficient documentation

## 2016-01-25 MED ORDER — BACITRACIN ZINC 500 UNIT/GM EX OINT
TOPICAL_OINTMENT | CUTANEOUS | Status: AC
  Filled 2016-01-25: qty 0.9

## 2016-01-25 NOTE — ED Provider Notes (Signed)
CSN: [id number]     Arrival date & time 01/25/16  1730 History   First MD Initiated Contact with Patient 01/25/16 1740     Chief Complaint  Patient presents with  . Head Laceration      HPI Pt was seen at 1740. Per pt, c/o sudden onset and resolution of one episode of head injury that occurred last night approximately 2300/2330. Pt states he fell and hit his head on the corner of a table. Pt states he "didn't want to come here but my wife made me." Pt not forthcoming regarding circumstances of fall. Denies LOC, no AMS, no neck or back pain, no focal motor weakness, no tingling/numbness in extremities.    Td UTD per pt Past Medical History  Diagnosis Date  . DIZZINESS   . ELECTROCARDIOGRAM, ABNORMAL   . ATTENTION DEFICIT DISORDER, HX OF   . CHEST PAIN, ATYPICAL, HX OF    History reviewed. No pertinent past surgical history.   Family History  Problem Relation Age of Onset  . Heart attack Mother   . Heart disease Mother   . Heart attack Father   . Heart disease Father   . Heart disease Brother   . Stroke Brother   . Heart attack Brother     CABG x3   Social History  Substance Use Topics  . Smoking status: Current Every Day Smoker -- 1.00 packs/day for 10 years    Types: Cigarettes  . Smokeless tobacco: None  . Alcohol Use: No    Review of Systems ROS: Statement: All systems negative except as marked or noted in the HPI; Constitutional: Negative for fever and chills. ; ; Eyes: Negative for eye pain, redness and discharge. ; ; ENMT: Negative for ear pain, hoarseness, nasal congestion, sinus pressure and sore throat. ; ; Cardiovascular: Negative for chest pain, palpitations, diaphoresis, dyspnea and peripheral edema. ; ; Respiratory: Negative for cough, wheezing and stridor. ; ; Gastrointestinal: Negative for nausea, vomiting, diarrhea, abdominal pain, blood in stool, hematemesis, jaundice and rectal bleeding. . ; ; Genitourinary: Negative for dysuria, flank pain and hematuria. ;  ; Musculoskeletal: +head injury. Negative for back pain and neck pain. Negative for swelling and deformity.; ; Skin: +laceration. Negative for pruritus, rash, abrasions, blisters, bruising and skin lesion.; ; Neuro: Negative for headache, lightheadedness and neck stiffness. Negative for weakness, altered level of consciousness , altered mental status, extremity weakness, paresthesias, involuntary movement, seizure and syncope.        Allergies  Penicillins  Home Medications   Prior to Admission medications   Medication Sig Start Date End Date Taking? Authorizing Provider  ibuprofen (ADVIL,MOTRIN) 200 MG tablet Take 200 mg by mouth every 6 (six) hours as needed for moderate pain.   Yes Historical Provider, MD   BP 141/86 mmHg  Pulse 85  Temp(Src) 98 F (36.7 C) (Oral)  Resp 18  Ht  (1.702 m)  Wt 180 lb (81.647 kg)  BMI 28.19 kg/m2  SpO2 100% Physical Exam  1745:  Physical examination:  Nursing notes reviewed; Vital signs and O2 SAT reviewed;  Constitutional: Well developed, Well nourished, Well hydrated, In no acute distress; Head:  Normocephalic, +2-3cm vertical superficial lac to left posterior scalp, hemostatic. No surrounding erythema..; Eyes: EOMI, PERRL, No scleral icterus; ENMT: Mouth and pharynx normal, Mucous membranes moist; Neck: Supple, Full range of motion, No lymphadenopathy; Spine:  No midline CS, TS, LS tenderness.;; Cardiovascular: Regular rate and rhythm, No murmur, rub, or gallop; Respiratory: Breath sounds clear &  equal bilaterally, No rales, rhonchi, wheezes.  Speaking full sentences with ease, Normal respiratory effort/excursion; Chest: Nontender, Movement normal; Abdomen: Soft, Nontender, Nondistended, Normal bowel sounds; Genitourinary: No CVA tenderness; Extremities: Pulses normal, No tenderness, No edema, No calf edema or asymmetry.; Neuro: AA&Ox3, Major CN grossly intact.  Speech clear. No gross focal motor or sensory deficits in extremities.; Skin: Color  normal, Warm, Dry.   ED Course  Procedures (including critical care time) Labs Review   Imaging Review  I have personally reviewed and evaluated these images and lab results as part of my medical decision-making.   EKG Interpretation None      MDM  MDM Reviewed: previous chart, nursing note and vitals Interpretation: CT scan     Ct Head Wo Contrast 01/25/2016  CLINICAL DATA:  Fall yesterday hitting head on cord table with headaches EXAM: CT HEAD WITHOUT CONTRAST TECHNIQUE: Contiguous axial images were obtained from the base of the skull through the vertex without intravenous contrast. COMPARISON:  01/06/2006 FINDINGS: The bony calvarium is intact. Scalp injury is noted posteriorly on the left consistent with the given clinical history. No sizable hematoma is seen. No findings to suggest acute hemorrhage, acute infarction or space-occupying mass lesion are noted. IMPRESSION: Scalp injury without acute intracranial abnormality. Electronically Signed   By: Alcide CleverMark  Lukens M.D.   On: 01/25/2016 18:28    1845:  Pt agreeable to CT scan and wound cleaning/DSD. CT reassuring. Wound care provided to scalp lac (not closed due to 17+ hour old injury). Dx and testing d/w pt.  Questions answered.  Verb understanding, agreeable to d/c home with outpt f/u.   Samuel JesterKathleen Kaoru Rezendes, DO 01/29/16 1446

## 2016-01-25 NOTE — ED Notes (Signed)
Pt states he fell last night and hit his head on a table.  Pt states he does not want anything done and is just here to please his wife.  Attempted to reason with pt at triage about receiving treatment, but pt states he just wants to see the doctor but does not want anything done.

## 2016-01-25 NOTE — Discharge Instructions (Signed)
Take over the counter tylenol and ibuprofen, as directed on packaging, as needed for discomfort.  Wash the area with soap and water at least twice a day, and cover with a clean/dry dressing.  Change the dressing whenever it becomes wet or soiled after washing the area with soap and water. Watch for signs of infection (such as redness, drainage, fevers). Call your regular medical doctor Monday morning to schedule a follow up appointment for a recheck within the next 48 hours.  Return to the Emergency Department immediately if worsening.

## 2016-05-12 IMAGING — CT CT HEAD W/O CM
1 of 2 series · 16 of 30 positions shown, 20 images · non-contrast
Comparison: 01/06/2006

CLINICAL DATA: Fall yesterday hitting head on cord table with
headaches

EXAM:
CT HEAD WITHOUT CONTRAST
TECHNIQUE: Contiguous axial images were obtained from the base of the skull
through the vertex without intravenous contrast.

[Series 3: headtrauma 2.4 h60s · axial · 0.43mm/px · z∈[+102,+257]mm · 16 of 72 slices shown, 20 images]
[im 4/72  brain]
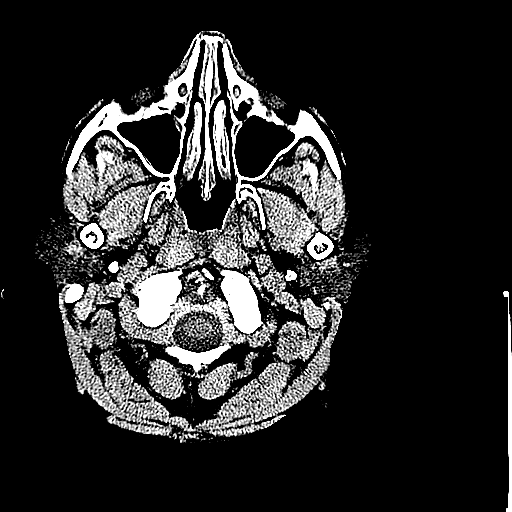
[im 4/72  bone]
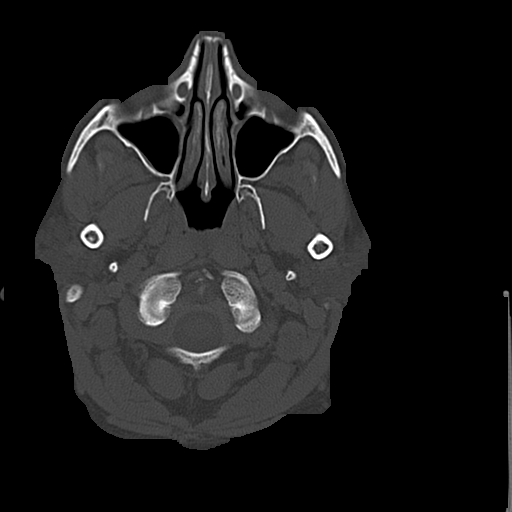
[im 8/72  brain]
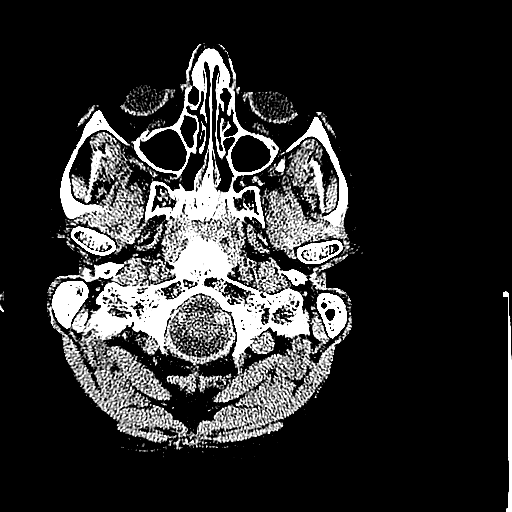
[im 12/72  brain]
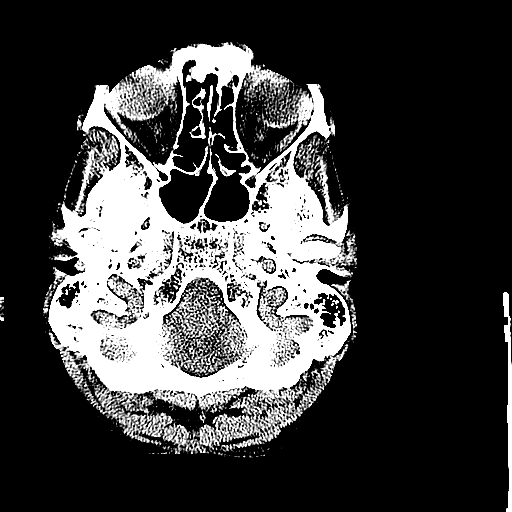
[im 15/72  brain]
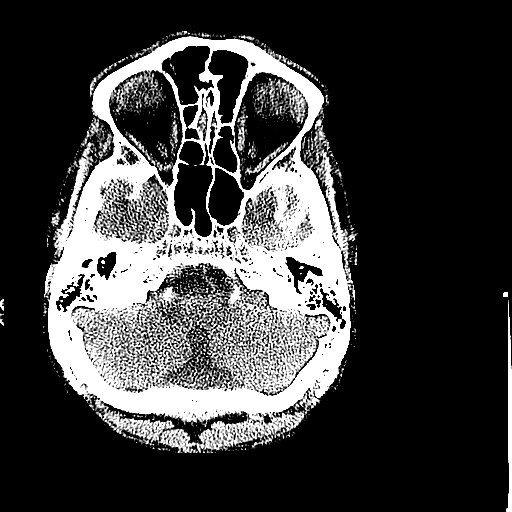
[im 23/72  brain]
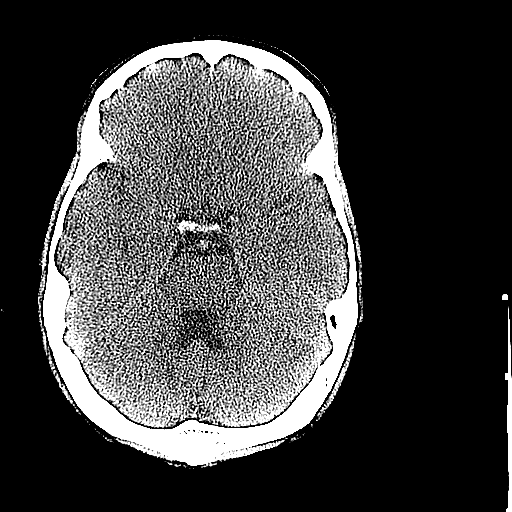
[im 23/72  bone]
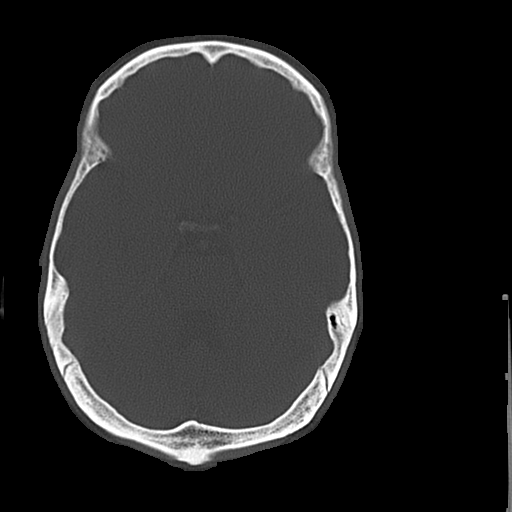
[im 27/72  brain]
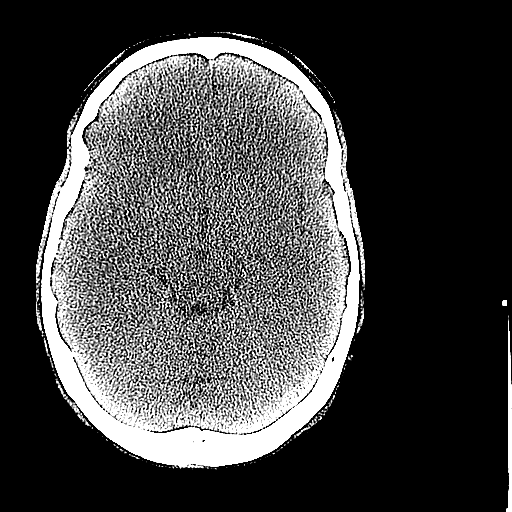
[im 30/72  brain]
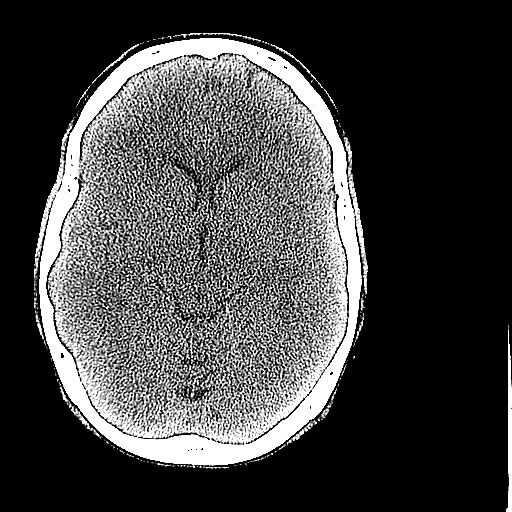
[im 34/72  brain]
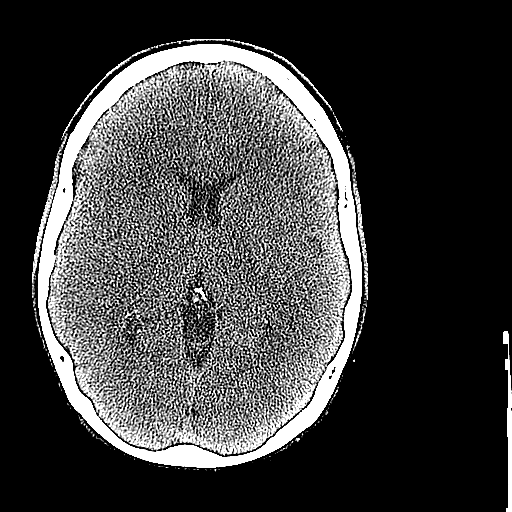
[im 38/72  brain]
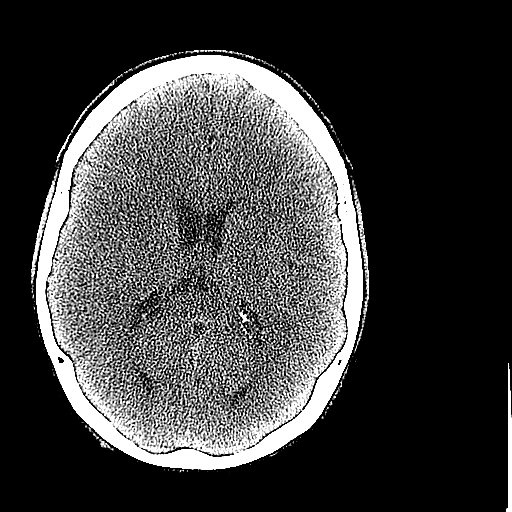
[im 38/72  bone]
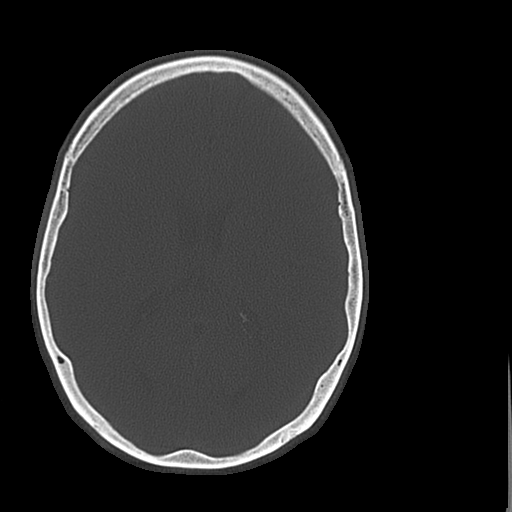
[im 42/72  brain]
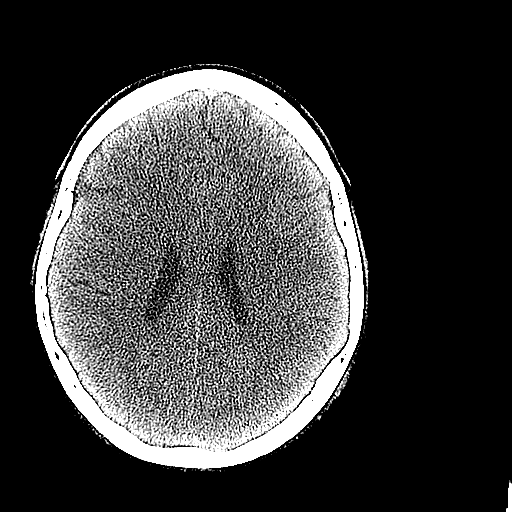
[im 45/72  brain]
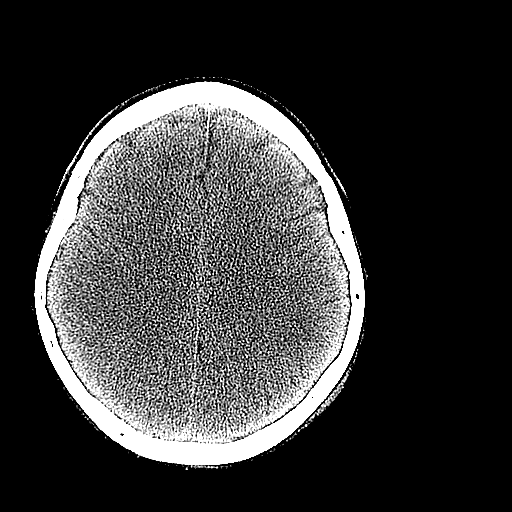
[im 49/72  brain]
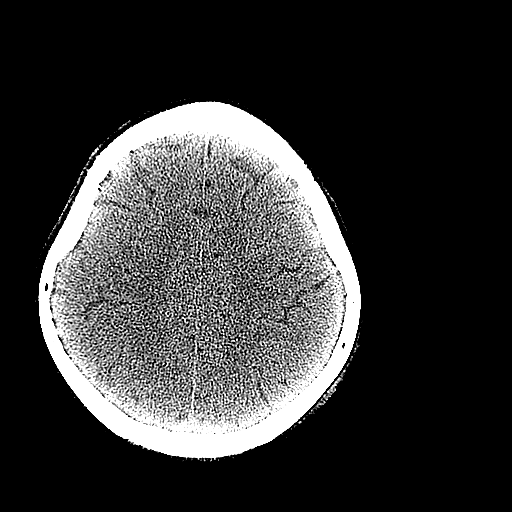
[im 57/72  brain]
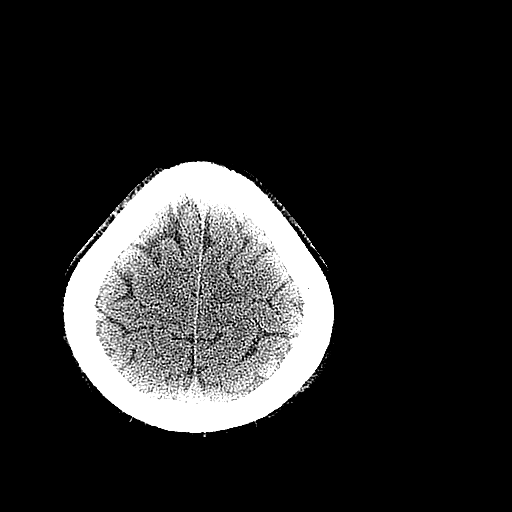
[im 57/72  bone]
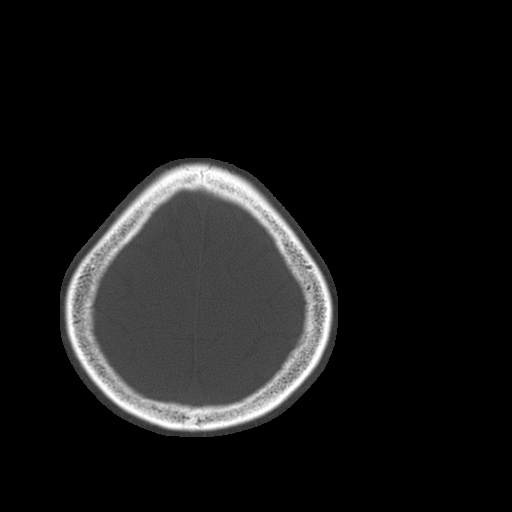
[im 60/72  brain]
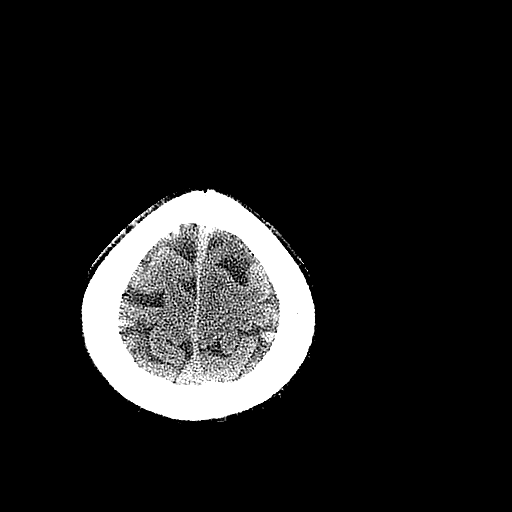
[im 64/72  brain]
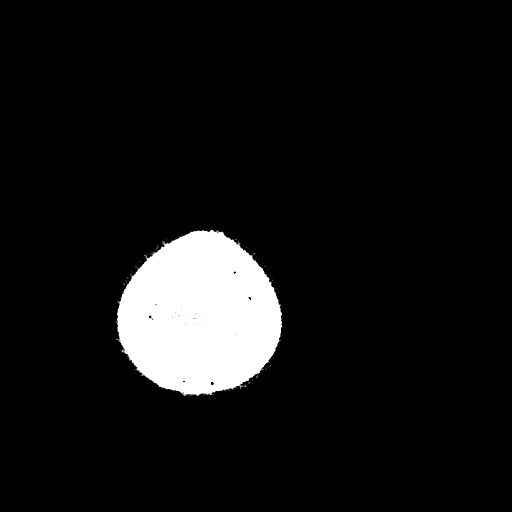
[im 68/72  brain]
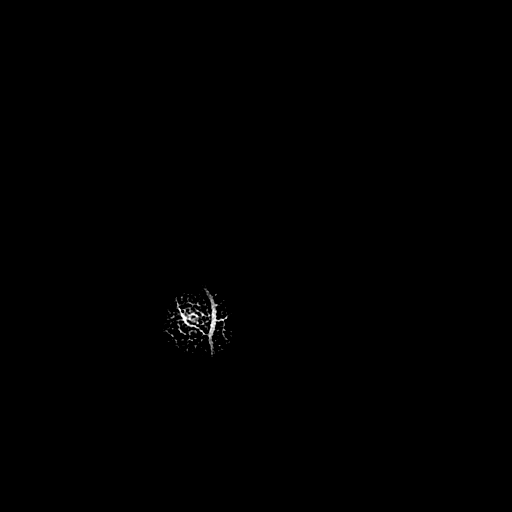

[16 of 30 positions shown; findings below may reference images not displayed]

FINDINGS: The bony calvarium is intact. Scalp injury is noted posteriorly on
the left consistent with the given clinical history. No sizable
hematoma is seen. No findings to suggest acute hemorrhage, acute
infarction or space-occupying mass lesion are noted.
IMPRESSION: Scalp injury without acute intracranial abnormality.

## 2017-06-12 ENCOUNTER — Emergency Department (HOSPITAL_COMMUNITY)
Admission: EM | Admit: 2017-06-12 | Discharge: 2017-06-12 | Disposition: A | Discharge status: Home / Self Care | Payer: Medicare Other | Attending: Emergency Medicine | Admitting: Emergency Medicine

## 2017-06-12 DIAGNOSIS — Z5321 Procedure and treatment not carried out due to patient leaving prior to being seen by health care provider: Secondary | ICD-10-CM | POA: Insufficient documentation

## 2017-06-12 DIAGNOSIS — M25571 Pain in right ankle and joints of right foot: Secondary | ICD-10-CM | POA: Diagnosis present

## 2017-06-12 NOTE — ED Triage Notes (Signed)
Upon telling the patient that I didn't have an empty room for him at this time, the patient stated he was going to go see his primary care doctor instead of waiting.

## 2017-06-12 NOTE — ED Triage Notes (Addendum)
Pt reports intermittent swelling of his right ankle over that past year and wants to have it looked at.  The patient denies injury or trauma, is ambulatory without diff.

## 2020-04-23 ENCOUNTER — Encounter (HOSPITAL_COMMUNITY)
Admission: EM | Disposition: A | Discharge status: Home / Self Care | Payer: Self-pay | Source: Ambulatory Visit | Attending: Cardiovascular Disease

## 2020-04-23 ENCOUNTER — Inpatient Hospital Stay (HOSPITAL_COMMUNITY): Payer: Medicare Other

## 2020-04-23 ENCOUNTER — Inpatient Hospital Stay (HOSPITAL_COMMUNITY)
Admission: EM | Admit: 2020-04-23 | Discharge: 2020-04-24 | DRG: 247 | Disposition: A | Discharge status: Home / Self Care | Payer: Medicare Other | Source: Ambulatory Visit | Attending: Cardiovascular Disease | Admitting: Cardiovascular Disease

## 2020-04-23 ENCOUNTER — Encounter (HOSPITAL_COMMUNITY): Payer: Self-pay

## 2020-04-23 DIAGNOSIS — I2111 ST elevation (STEMI) myocardial infarction involving right coronary artery: Secondary | ICD-10-CM

## 2020-04-23 DIAGNOSIS — I25110 Atherosclerotic heart disease of native coronary artery with unstable angina pectoris: Secondary | ICD-10-CM

## 2020-04-23 DIAGNOSIS — E876 Hypokalemia: Secondary | ICD-10-CM | POA: Diagnosis not present

## 2020-04-23 DIAGNOSIS — Z880 Allergy status to penicillin: Secondary | ICD-10-CM | POA: Diagnosis not present

## 2020-04-23 DIAGNOSIS — I255 Ischemic cardiomyopathy: Secondary | ICD-10-CM | POA: Diagnosis present

## 2020-04-23 DIAGNOSIS — E785 Hyperlipidemia, unspecified: Secondary | ICD-10-CM | POA: Diagnosis present

## 2020-04-23 DIAGNOSIS — I2119 ST elevation (STEMI) myocardial infarction involving other coronary artery of inferior wall: Secondary | ICD-10-CM | POA: Diagnosis not present

## 2020-04-23 DIAGNOSIS — R001 Bradycardia, unspecified: Secondary | ICD-10-CM | POA: Diagnosis not present

## 2020-04-23 DIAGNOSIS — Y831 Surgical operation with implant of artificial internal device as the cause of abnormal reaction of the patient, or of later complication, without mention of misadventure at the time of the procedure: Secondary | ICD-10-CM | POA: Diagnosis not present

## 2020-04-23 DIAGNOSIS — I959 Hypotension, unspecified: Secondary | ICD-10-CM | POA: Diagnosis not present

## 2020-04-23 DIAGNOSIS — F988 Other specified behavioral and emotional disorders with onset usually occurring in childhood and adolescence: Secondary | ICD-10-CM | POA: Diagnosis not present

## 2020-04-23 DIAGNOSIS — F17210 Nicotine dependence, cigarettes, uncomplicated: Secondary | ICD-10-CM | POA: Diagnosis present

## 2020-04-23 DIAGNOSIS — Z8249 Family history of ischemic heart disease and other diseases of the circulatory system: Secondary | ICD-10-CM

## 2020-04-23 DIAGNOSIS — Z720 Tobacco use: Secondary | ICD-10-CM | POA: Diagnosis not present

## 2020-04-23 DIAGNOSIS — Z20822 Contact with and (suspected) exposure to covid-19: Secondary | ICD-10-CM | POA: Diagnosis not present

## 2020-04-23 DIAGNOSIS — I10 Essential (primary) hypertension: Secondary | ICD-10-CM

## 2020-04-23 DIAGNOSIS — I213 ST elevation (STEMI) myocardial infarction of unspecified site: Secondary | ICD-10-CM | POA: Diagnosis not present

## 2020-04-23 DIAGNOSIS — F17200 Nicotine dependence, unspecified, uncomplicated: Secondary | ICD-10-CM

## 2020-04-23 DIAGNOSIS — T82855A Stenosis of coronary artery stent, initial encounter: Secondary | ICD-10-CM | POA: Diagnosis not present

## 2020-04-23 DIAGNOSIS — Z955 Presence of coronary angioplasty implant and graft: Secondary | ICD-10-CM

## 2020-04-23 DIAGNOSIS — I2510 Atherosclerotic heart disease of native coronary artery without angina pectoris: Secondary | ICD-10-CM

## 2020-04-23 HISTORY — DX: Atherosclerotic heart disease of native coronary artery with unstable angina pectoris: I25.110

## 2020-04-23 HISTORY — PX: LEFT HEART CATH AND CORONARY ANGIOGRAPHY: CATH118249

## 2020-04-23 HISTORY — PX: CORONARY/GRAFT ACUTE MI REVASCULARIZATION: CATH118305

## 2020-04-23 HISTORY — DX: ST elevation (STEMI) myocardial infarction involving right coronary artery: I21.11

## 2020-04-23 LAB — POCT I-STAT, CHEM 8
BUN: 18 mg/dL (ref 6–20)
Calcium, Ion: 1.2 mmol/L (ref 1.15–1.40)
Chloride: 97 mmol/L — ABNORMAL LOW (ref 98–111)
Creatinine, Ser: 1.1 mg/dL (ref 0.61–1.24)
Glucose, Bld: 112 mg/dL — ABNORMAL HIGH (ref 70–99)
HCT: 45 % (ref 39.0–52.0)
Hemoglobin: 15.3 g/dL (ref 13.0–17.0)
Potassium: 2.8 mmol/L — ABNORMAL LOW (ref 3.5–5.1)
Sodium: 141 mmol/L (ref 135–145)
TCO2: 26 mmol/L (ref 22–32)

## 2020-04-23 LAB — BASIC METABOLIC PANEL
Anion gap: 10 (ref 5–15)
BUN: 16 mg/dL (ref 6–20)
CO2: 25 mmol/L (ref 22–32)
Calcium: 8.6 mg/dL — ABNORMAL LOW (ref 8.9–10.3)
Chloride: 102 mmol/L (ref 98–111)
Creatinine, Ser: 1.1 mg/dL (ref 0.61–1.24)
GFR calc Af Amer: >60 mL/min (ref >60)
GFR calc non Af Amer: >60 mL/min (ref >60)
Glucose, Bld: 84 mg/dL (ref 70–99)
Potassium: 3.1 mmol/L — ABNORMAL LOW (ref 3.5–5.1)
Sodium: 137 mmol/L (ref 135–145)

## 2020-04-23 LAB — COMPREHENSIVE METABOLIC PANEL
ALT: 18 U/L (ref 0–44)
AST: 29 U/L (ref 15–41)
Albumin: 3.1 g/dL — ABNORMAL LOW (ref 3.5–5.0)
Alkaline Phosphatase: 70 U/L (ref 38–126)
Anion gap: 9 (ref 5–15)
BUN: 17 mg/dL (ref 6–20)
CO2: 27 mmol/L (ref 22–32)
Calcium: 8.7 mg/dL — ABNORMAL LOW (ref 8.9–10.3)
Chloride: 101 mmol/L (ref 98–111)
Creatinine, Ser: 1.19 mg/dL (ref 0.61–1.24)
GFR calc Af Amer: >60 mL/min (ref >60)
GFR calc non Af Amer: >60 mL/min (ref >60)
Glucose, Bld: 111 mg/dL — ABNORMAL HIGH (ref 70–99)
Potassium: 2.6 mmol/L — CL (ref 3.5–5.1)
Sodium: 137 mmol/L (ref 135–145)
Total Bilirubin: 0.6 mg/dL (ref 0.3–1.2)
Total Protein: 6.5 g/dL (ref 6.5–8.1)

## 2020-04-23 LAB — CBC
HCT: 43.8 % (ref 39.0–52.0)
HCT: 41.3 % (ref 39.0–52.0)
Hemoglobin: 15 g/dL (ref 13.0–17.0)
Hemoglobin: 13.8 g/dL (ref 13.0–17.0)
MCH: 31.8 pg (ref 26.0–34.0)
MCH: 31.2 pg (ref 26.0–34.0)
MCHC: 34.2 g/dL (ref 30.0–36.0)
MCHC: 33.4 g/dL (ref 30.0–36.0)
MCV: 92.8 fL (ref 80.0–100.0)
MCV: 93.4 fL (ref 80.0–100.0)
Platelets: 227 10*3/uL (ref 150–400)
Platelets: 212 10*3/uL (ref 150–400)
RBC: 4.72 MIL/uL (ref 4.22–5.81)
RBC: 4.42 MIL/uL (ref 4.22–5.81)
RDW: 13.2 % (ref 11.5–15.5)
RDW: 13.2 % (ref 11.5–15.5)
WBC: 12.6 10*3/uL — ABNORMAL HIGH (ref 4.0–10.5)
WBC: 9.5 10*3/uL (ref 4.0–10.5)
nRBC: 0 % (ref 0.0–0.2)
nRBC: 0 % (ref 0.0–0.2)

## 2020-04-23 LAB — PROTIME-INR
INR: 1.1 (ref 0.8–1.2)
Prothrombin Time: 14 seconds (ref 11.4–15.2)

## 2020-04-23 LAB — POCT ACTIVATED CLOTTING TIME: Activated Clotting Time: 632 seconds

## 2020-04-23 LAB — LIPID PANEL
Cholesterol: 166 mg/dL (ref 0–200)
HDL: 38 mg/dL — ABNORMAL LOW (ref >40)
LDL Cholesterol: 117 mg/dL — ABNORMAL HIGH (ref 0–99)
Total CHOL/HDL Ratio: 4.4 RATIO
Triglycerides: 56 mg/dL (ref <150)
VLDL: 11 mg/dL (ref 0–40)

## 2020-04-23 LAB — HEMOGLOBIN A1C
Hgb A1c MFr Bld: 5.4 % (ref 4.8–5.6)
Mean Plasma Glucose: 108.28 mg/dL

## 2020-04-23 LAB — ECHOCARDIOGRAM COMPLETE
Area-P 1/2: 3.6 cm2
S' Lateral: 3.7 cm
Weight: 3051.17 oz

## 2020-04-23 LAB — TROPONIN I (HIGH SENSITIVITY)
Troponin I (High Sensitivity): 1459 ng/L (ref <18)
Troponin I (High Sensitivity): >27000 ng/L (ref <18)

## 2020-04-23 LAB — APTT: aPTT: 86 seconds — ABNORMAL HIGH (ref 24–36)

## 2020-04-23 LAB — MRSA PCR SCREENING: MRSA by PCR: NEGATIVE

## 2020-04-23 LAB — POTASSIUM: Potassium: 3.9 mmol/L (ref 3.5–5.1)

## 2020-04-23 LAB — MAGNESIUM: Magnesium: 2 mg/dL (ref 1.7–2.4)

## 2020-04-23 LAB — SARS CORONAVIRUS 2 BY RT PCR (HOSPITAL ORDER, PERFORMED IN ~~LOC~~ HOSPITAL LAB): SARS Coronavirus 2: NEGATIVE

## 2020-04-23 SURGERY — CORONARY/GRAFT ACUTE MI REVASCULARIZATION
Anesthesia: LOCAL

## 2020-04-23 MED ORDER — TICAGRELOR 90 MG PO TABS
ORAL_TABLET | ORAL | Status: AC
  Filled 2020-04-23: qty 1

## 2020-04-23 MED ORDER — HEPARIN (PORCINE) IN NACL 1000-0.9 UT/500ML-% IV SOLN
INTRAVENOUS | Status: DC | PRN
  Administered 2020-04-23 (×2): 500 mL

## 2020-04-23 MED ORDER — SODIUM CHLORIDE 0.9% FLUSH
3.0000 mL | Freq: Two times a day (BID) | INTRAVENOUS | Status: DC
  Administered 2020-04-23 (×3): 3 mL via INTRAVENOUS

## 2020-04-23 MED ORDER — TICAGRELOR 90 MG PO TABS
ORAL_TABLET | ORAL | Status: DC | PRN
  Administered 2020-04-23: 180 mg via ORAL

## 2020-04-23 MED ORDER — SODIUM CHLORIDE 0.9 % IV SOLN: INTRAVENOUS | Status: DC

## 2020-04-23 MED ORDER — LIDOCAINE HCL (PF) 1 % IJ SOLN
INTRAMUSCULAR | Status: DC | PRN
  Administered 2020-04-23: 5 mL via SUBCUTANEOUS

## 2020-04-23 MED ORDER — ATROPINE SULFATE 1 MG/10ML IJ SOSY
PREFILLED_SYRINGE | INTRAMUSCULAR | Status: AC
  Filled 2020-04-23: qty 10

## 2020-04-23 MED ORDER — HEPARIN SODIUM (PORCINE) 1000 UNIT/ML IJ SOLN
INTRAMUSCULAR | Status: AC
  Filled 2020-04-23: qty 1

## 2020-04-23 MED ORDER — CHLORHEXIDINE GLUCONATE CLOTH 2 % EX PADS
6.0000 | MEDICATED_PAD | Freq: Every day | CUTANEOUS | Status: DC
  Administered 2020-04-23 – 2020-04-24 (×2): 6 via TOPICAL

## 2020-04-23 MED ORDER — MIDAZOLAM HCL 2 MG/2ML IJ SOLN
INTRAMUSCULAR | Status: DC | PRN
  Administered 2020-04-23: 2 mg via INTRAVENOUS

## 2020-04-23 MED ORDER — LIDOCAINE HCL (PF) 1 % IJ SOLN
INTRAMUSCULAR | Status: AC
  Filled 2020-04-23: qty 30

## 2020-04-23 MED ORDER — ATROPINE SULFATE 1 MG/10ML IJ SOSY
PREFILLED_SYRINGE | INTRAMUSCULAR | Status: DC | PRN
  Administered 2020-04-23: 0.5 mg via INTRAVENOUS

## 2020-04-23 MED ORDER — BIVALIRUDIN TRIFLUOROACETATE 250 MG IV SOLR
INTRAVENOUS | Status: AC
  Filled 2020-04-23: qty 250

## 2020-04-23 MED ORDER — HEPARIN (PORCINE) IN NACL 1000-0.9 UT/500ML-% IV SOLN
INTRAVENOUS | Status: AC
  Filled 2020-04-23: qty 1000

## 2020-04-23 MED ORDER — SODIUM CHLORIDE 0.9 % IV SOLN
INTRAVENOUS | Status: AC | PRN
  Administered 2020-04-23: 1.75 mg/kg/h via INTRAVENOUS

## 2020-04-23 MED ORDER — ASPIRIN 81 MG PO CHEW
81.0000 mg | CHEWABLE_TABLET | Freq: Every day | ORAL | Status: DC
  Administered 2020-04-23 – 2020-04-24 (×2): 81 mg via ORAL
  Filled 2020-04-23 (×2): qty 1

## 2020-04-23 MED ORDER — MAGNESIUM SULFATE IN D5W 1-5 GM/100ML-% IV SOLN
1.0000 g | Freq: Once | INTRAVENOUS | Status: AC
  Administered 2020-04-23: 1 g via INTRAVENOUS
  Filled 2020-04-23: qty 100

## 2020-04-23 MED ORDER — IOHEXOL 350 MG/ML SOLN
INTRAVENOUS | Status: DC | PRN
  Administered 2020-04-23: 110 mL via INTRA_ARTERIAL

## 2020-04-23 MED ORDER — BIVALIRUDIN BOLUS VIA INFUSION - CUPID
INTRAVENOUS | Status: DC | PRN
  Administered 2020-04-23: 63 mg via INTRAVENOUS

## 2020-04-23 MED ORDER — POTASSIUM CHLORIDE CRYS ER 20 MEQ PO TBCR
20.0000 meq | EXTENDED_RELEASE_TABLET | Freq: Once | ORAL | Status: AC
  Administered 2020-04-23: 20 meq via ORAL
  Filled 2020-04-23: qty 1

## 2020-04-23 MED ORDER — SODIUM CHLORIDE 0.9 % IV SOLN: 250.0000 mL | INTRAVENOUS | Status: DC | PRN

## 2020-04-23 MED ORDER — HYDRALAZINE HCL 20 MG/ML IJ SOLN: 10.0000 mg | INTRAMUSCULAR | Status: AC | PRN

## 2020-04-23 MED ORDER — TICAGRELOR 90 MG PO TABS
90.0000 mg | ORAL_TABLET | Freq: Two times a day (BID) | ORAL | Status: DC
  Administered 2020-04-23 – 2020-04-24 (×3): 90 mg via ORAL
  Filled 2020-04-23 (×3): qty 1

## 2020-04-23 MED ORDER — ZOLPIDEM TARTRATE 5 MG PO TABS: 5.0000 mg | ORAL_TABLET | Freq: Every evening | ORAL | Status: DC | PRN

## 2020-04-23 MED ORDER — FENTANYL CITRATE (PF) 100 MCG/2ML IJ SOLN
INTRAMUSCULAR | Status: DC | PRN
  Administered 2020-04-23: 50 ug via INTRAVENOUS

## 2020-04-23 MED ORDER — POTASSIUM CHLORIDE 10 MEQ/100ML IV SOLN
10.0000 meq | INTRAVENOUS | Status: AC
  Administered 2020-04-23 (×4): 10 meq via INTRAVENOUS
  Filled 2020-04-23 (×4): qty 100

## 2020-04-23 MED ORDER — SODIUM CHLORIDE 0.9 % IV SOLN
INTRAVENOUS | Status: AC | PRN
  Administered 2020-04-23: 150 mL/h via INTRAVENOUS

## 2020-04-23 MED ORDER — DIAZEPAM 5 MG PO TABS: 5.0000 mg | ORAL_TABLET | Freq: Four times a day (QID) | ORAL | Status: DC | PRN

## 2020-04-23 MED ORDER — ACETAMINOPHEN 325 MG PO TABS: 650.0000 mg | ORAL_TABLET | ORAL | Status: DC | PRN

## 2020-04-23 MED ORDER — METOPROLOL TARTRATE 12.5 MG HALF TABLET
12.5000 mg | ORAL_TABLET | Freq: Two times a day (BID) | ORAL | Status: DC
  Administered 2020-04-23 – 2020-04-24 (×3): 12.5 mg via ORAL
  Filled 2020-04-23 (×3): qty 1

## 2020-04-23 MED ORDER — IOHEXOL 350 MG/ML SOLN
INTRAVENOUS | Status: AC
  Filled 2020-04-23: qty 1

## 2020-04-23 MED ORDER — FENTANYL CITRATE (PF) 100 MCG/2ML IJ SOLN
INTRAMUSCULAR | Status: AC
  Filled 2020-04-23: qty 2

## 2020-04-23 MED ORDER — SODIUM CHLORIDE 0.9% FLUSH: 3.0000 mL | INTRAVENOUS | Status: DC | PRN

## 2020-04-23 MED ORDER — ATORVASTATIN CALCIUM 80 MG PO TABS
80.0000 mg | ORAL_TABLET | Freq: Every day | ORAL | Status: DC
  Administered 2020-04-23 – 2020-04-24 (×2): 80 mg via ORAL
  Filled 2020-04-23 (×2): qty 1

## 2020-04-23 MED ORDER — POTASSIUM CHLORIDE CRYS ER 20 MEQ PO TBCR
40.0000 meq | EXTENDED_RELEASE_TABLET | ORAL | Status: AC
  Administered 2020-04-23 (×2): 40 meq via ORAL
  Filled 2020-04-23 (×2): qty 2

## 2020-04-23 MED ORDER — VERAPAMIL HCL 2.5 MG/ML IV SOLN
INTRAVENOUS | Status: DC | PRN
  Administered 2020-04-23: 10 mL via INTRA_ARTERIAL

## 2020-04-23 MED ORDER — LABETALOL HCL 5 MG/ML IV SOLN: 10.0000 mg | INTRAVENOUS | Status: DC | PRN

## 2020-04-23 MED ORDER — MIDAZOLAM HCL 2 MG/2ML IJ SOLN
INTRAMUSCULAR | Status: AC
  Filled 2020-04-23: qty 10

## 2020-04-23 MED ORDER — SODIUM CHLORIDE 0.9 % IV SOLN
1.7500 mg/kg/h | INTRAVENOUS | Status: DC
  Administered 2020-04-23: 1.75 mg/kg/h via INTRAVENOUS
  Filled 2020-04-23 (×2): qty 250

## 2020-04-23 MED ORDER — ONDANSETRON HCL 4 MG/2ML IJ SOLN: 4.0000 mg | Freq: Four times a day (QID) | INTRAMUSCULAR | Status: DC | PRN

## 2020-04-23 SURGICAL SUPPLY — 16 items
BALLN SAPPHIRE 2.0X12 (BALLOONS) ×2
BALLN SAPPHIRE ~~LOC~~ 2.5X12 (BALLOONS) ×1 IMPLANT
BALLOON SAPPHIRE 2.0X12 (BALLOONS) IMPLANT
CATH OPTITORQUE TIG 4.0 5F (CATHETERS) ×1 IMPLANT
CATH VISTA GUIDE 6FR JR4 (CATHETERS) ×1 IMPLANT
DEVICE RAD COMP TR BAND LRG (VASCULAR PRODUCTS) ×1 IMPLANT
GLIDESHEATH SLEND SS 6F .021 (SHEATH) ×1 IMPLANT
GUIDEWIRE INQWIRE 1.5J.035X260 (WIRE) IMPLANT
INQWIRE 1.5J .035X260CM (WIRE) ×2
KIT ENCORE 26 ADVANTAGE (KITS) ×1 IMPLANT
KIT HEART LEFT (KITS) ×2 IMPLANT
PACK CARDIAC CATHETERIZATION (CUSTOM PROCEDURE TRAY) ×2 IMPLANT
STENT RESOLUTE ONYX 2.5X18 (Permanent Stent) ×1 IMPLANT
TRANSDUCER W/STOPCOCK (MISCELLANEOUS) ×2 IMPLANT
TUBING CIL FLEX 10 FLL-RA (TUBING) ×2 IMPLANT
WIRE COUGAR XT STRL 190CM (WIRE) ×1 IMPLANT

## 2020-04-23 NOTE — Progress Notes (Signed)
Patient valuables are in security. Lock box Number 4.  Two RNs counted and verified twice $7134 cash and walked it to security. Witnessed and counted with Jolinda Croak RN. Yellow copy of valuables receipt and valuables envelope [phone] were placed in the patient's shadow chart.

## 2020-04-23 NOTE — Progress Notes (Addendum)
Progress Note  Patient Name: Francisco Dodson Date of Encounter: 04/24/2020  [facility] Cardiologist: Nicki Guadalajara, MD - Dr. Tresa Endo  Subjective   Feels great this morning.  100% better than he did on arrival. [place] all the way around the nursing station and unit without any difficulty. No breathing issues and no chest pain.  Inpatient Medications    Scheduled Meds: . aspirin  81 mg Oral Daily  . atorvastatin  80 mg Oral Daily  . Chlorhexidine Gluconate Cloth  6 each Topical Daily  . metoprolol tartrate  12.5 mg Oral BID  . potassium chloride  40 mEq Oral Once  . sodium chloride flush  3 mL Intravenous Q12H  . ticagrelor  90 mg Oral BID   Continuous Infusions: . sodium chloride 10 mL/hr at 04/24/20 0000  . sodium chloride     PRN Meds: sodium chloride, acetaminophen, diazepam, nitroGLYCERIN, ondansetron (ZOFRAN) IV, sodium chloride flush, zolpidem   Vital Signs    Vitals:   04/24/20 0900 04/24/20 1000 04/24/20 1100 04/24/20 1118  BP: 103/67  113/71   Pulse: 66 80 70   Resp: 18 18 18    Temp:    97.7 F (36.5 C)  TempSrc:    Oral  SpO2: 98% 99% 99%   Weight:        Intake/Output Summary (Last 24 hours) at 04/24/2020 1347 Last data filed at 04/24/2020 1200 Gross per 24 hour  Intake 2268.31 ml  Output 760 ml  Net 1508.31 ml   Last 3 Weights 04/23/2020 04/23/2020 01/25/2016  Weight (lbs) 190 lb 11.2 oz 185 lb 3 oz 180 lb  Weight (kg) 86.5 kg 84 kg 81.647 kg      Telemetry    Sinus rhythm- Personally Reviewed  ECG    Sinus rhythm with evolving changes of inferior MI mostly noted now III.- Personally Reviewed  Physical Exam   Physical Exam Constitutional:      General: He is not in acute distress.    Appearance: Normal appearance. He is normal weight. He is not ill-appearing.     Comments: Healthy-appearing  HENT:     Head: Normocephalic and atraumatic.  Neck:     Vascular: No carotid bruit.  Cardiovascular:     Rate and Rhythm: Normal rate and regular  rhythm.     Pulses: Normal pulses.     Heart sounds: Normal heart sounds. No murmur heard.  No friction rub. No gallop.   Pulmonary:     Effort: Pulmonary effort is normal. No respiratory distress.     Breath sounds: Normal breath sounds.  Chest:     Chest wall: No tenderness.  Musculoskeletal:        General: No swelling.     Cervical back: Normal range of motion.     Comments: Right radial access site with no hematoma.  No pain.  Dressing in place  Neurological:     General: No focal deficit present.     Mental Status: He is alert and oriented to person, place, and time.  Psychiatric:        Mood and Affect: Mood normal.        Behavior: Behavior normal.        Thought Content: Thought content normal.        Judgment: Judgment normal.       Labs    High Sensitivity Troponin:   Recent Labs  Lab 04/23/20 0114 04/23/20 0518  TROPONINIHS 1,459* >27,000*      Chemistry  Recent Labs  Lab 04/23/20 0114 04/23/20 0114 04/23/20 0115 04/23/20 0115 04/23/20 0518 04/23/20 1526 04/24/20 0224  NA 137   < > 141  --  137  --  138  K 2.6*   < > 2.8*   < > 3.1* 3.9 3.3*  CL 101   < > 97*  --  102  --  109  CO2 27  --   --   --  25  --  22  GLUCOSE 111*   < > 112*  --  84  --  102*  BUN 17   < > 18  --  16  --  18  CREATININE 1.19   < > 1.10  --  1.10  --  1.01  CALCIUM 8.7*  --   --   --  8.6*  --  8.2*  PROT 6.5  --   --   --   --   --   --   ALBUMIN 3.1*  --   --   --   --   --   --   AST 29  --   --   --   --   --   --   ALT 18  --   --   --   --   --   --   ALKPHOS 70  --   --   --   --   --   --   BILITOT 0.6  --   --   --   --   --   --   GFRNONAA >60  --   --   --  >60  --  >60  GFRAA >60  --   --   --  >60  --  >60  ANIONGAP 9  --   --   --  10  --  7   < > = values in this interval not displayed.     Hematology Recent Labs  Lab 04/23/20 0114 04/23/20 0115 04/23/20 0518  WBC 12.6*  --  9.5  RBC 4.72  --  4.42  HGB 15.0 15.3 13.8  HCT 43.8 45.0 41.3    MCV 92.8  --  93.4  MCH 31.8  --  31.2  MCHC 34.2  --  33.4  RDW 13.2  --  13.2  PLT 227  --  212    BNPNo results for input(s): BNP, PROBNP in the last 168 hours.   DDimer No results for input(s): DDIMER in the last 168 hours.   Radiology  - Cardiac Studies    CARDIAC CATHETERIZATION  Result Date: 04/23/2020  Prox RCA lesion is 20% stenosed.  Post intervention, there is a 0% residual stenosis.  Dist RCA lesion is 100% stenosed.  2nd Mrg-1 lesion is 85% stenosed.  2nd Mrg-2 lesion is 80% stenosed.  Mid LAD to Dist LAD lesion is 50% stenosed.  Dist LAD lesion is 50% stenosed.  Prox LAD lesion is 20% stenosed.  A stent was successfully placed.  Acute inferior wall myocardial infarction secondary to total occlusion of the distal RCA just beyond the acute margin with initial TIMI 0 flow. Multivessel CAD with luminal irregularity with narrowing up to 50% in the mid LAD; normal ramus immediate vessel; circumflex marginal stent stenosis in a small caliber vessel of 85% ostial and 80% in the midsegment; and 20% proximal RCA narrowing with total occlusion proximal to the PDA takeoff. Successful PCI to the RCA with insertion  of a 2.5 x 18 mm Resolute Onyx stent postdilated to 2.54 mm with the 100% occlusion reduced to 0% and restoration of TIMI-3 flow. Reperfusion associated transient hypotension requiring fluid bolus in addition to bradycardia treated with atropine 0.5 mg. RECOMMENDATION: DAPT for minimum of 12 months.  Will initiate atorvastatin 80 mg.  Initial medical therapy for concomitant CAD.  Smoking cessation is essential.  Plan 2D echo in a.m.   ECHOCARDIOGRAM COMPLETE  Result Date: 04/23/2020    ECHOCARDIOGRAM REPORT   Patient Name:   Francisco Dodson Date of Exam: 04/23/2020 Medical Rec #:  [id number]  Height:       67.0 in Accession #:    [id number] Weight:       190.7 lb Date of Birth:  1966-08-26  BSA:          1.982 m Patient Age:    53 years   BP:           96/61 mmHg Patient Gender: M           HR:           63 bpm. Exam Location:  Inpatient Procedure: 2D Echo, Cardiac Doppler and Color Doppler Indications:    Acute Myocardial Infarction 410  History:        Patient has no prior history of Echocardiogram examinations.                 CAD; Signs/Symptoms:Chest Pain.  Sonographer:    Elmarie Shiley Dance Referring Phys: 40 THOMAS A KELLY IMPRESSIONS  1. Left ventricular ejection fraction, by estimation, is 45 to 50%. The left ventricle has mildly decreased function. The left ventricle demonstrates regional wall motion abnormalities (see scoring diagram/findings for description). Left ventricular diastolic parameters were normal.  2. Right ventricular systolic function is normal. The right ventricular size is normal. Tricuspid regurgitation signal is inadequate for assessing PA pressure.  3. The mitral valve is grossly normal. No evidence of mitral valve regurgitation. No evidence of mitral stenosis.  4. The aortic valve is tricuspid. Aortic valve regurgitation is not visualized. No aortic stenosis is present.  5. The inferior vena cava is normal in size with greater than 50% respiratory variability, suggesting right atrial pressure of 3 mmHg. Conclusion(s)/Recommendation(s): Findings consistent with ischemic cardiomyopathy. FINDINGS  Left Ventricle: Left ventricular ejection fraction, by estimation, is 45 to 50%. The left ventricle has mildly decreased function. The left ventricle demonstrates regional wall motion abnormalities. The left ventricular internal cavity size was normal in size. There is no left ventricular hypertrophy. Left ventricular diastolic parameters were normal.  LV Wall Scoring: The inferior wall and posterior wall are hypokinetic. Right Ventricle: The right ventricular size is normal. No increase in right ventricular wall thickness. Right ventricular systolic function is normal. Tricuspid regurgitation signal is inadequate for assessing PA pressure. Left Atrium: Left atrial size was  normal in size. Right Atrium: Right atrial size was normal in size. Pericardium: Trivial pericardial effusion is present. Mitral Valve: The mitral valve is grossly normal. No evidence of mitral valve regurgitation. No evidence of mitral valve stenosis. Tricuspid Valve: The tricuspid valve is grossly normal. Tricuspid valve regurgitation is not demonstrated. No evidence of tricuspid stenosis. Aortic Valve: The aortic valve is tricuspid. Aortic valve regurgitation is not visualized. No aortic stenosis is present. Pulmonic Valve: The pulmonic valve was grossly normal. Pulmonic valve regurgitation is not visualized. No evidence of pulmonic stenosis. Aorta: The aortic root and ascending aorta are structurally normal, with no evidence of dilitation. Venous:  The inferior vena cava is normal in size with greater than 50% respiratory variability, suggesting right atrial pressure of 3 mmHg. IAS/Shunts: The atrial septum is grossly normal.  LEFT VENTRICLE PLAX 2D LVIDd:         4.80 cm  Diastology LVIDs:         3.70 cm  LV e' lateral:   10.10 cm/s LV PW:         1.10 cm  LV E/e' lateral: 8.5 LV IVS:        1.10 cm  LV e' medial:    9.46 cm/s LVOT diam:     1.90 cm  LV E/e' medial:  9.0 LV SV:         49 LV SV Index:   25 LVOT Area:     2.84 cm  RIGHT VENTRICLE            IVC RV Basal diam:  2.50 cm    IVC diam: 1.80 cm RV S prime:     9.36 cm/s TAPSE (M-mode): 1.7 cm LEFT ATRIUM             Index       RIGHT ATRIUM           Index LA diam:        4.10 cm 2.07 cm/m  RA Area:     16.90 cm LA Vol (A2C):   42.9 ml 21.65 ml/m RA Volume:   47.00 ml  23.72 ml/m LA Vol (A4C):   28.7 ml 14.48 ml/m LA Biplane Vol: 35.7 ml 18.01 ml/m  AORTIC VALVE LVOT Vmax:   96.80 cm/s LVOT Vmean:  56.200 cm/s LVOT VTI:    0.174 m  AORTA Ao Root diam: 3.30 cm Ao Asc diam:  2.70 cm MITRAL VALVE MV Area (PHT): 3.60 cm    SHUNTS MV Decel Time: 211 msec    Systemic VTI:  0.17 m MV E velocity: 85.40 cm/s  Systemic Diam: 1.90 cm MV A velocity: 72.30  cm/s MV E/A ratio:  1.18 Lennie Odor MD Electronically signed by Lennie Odor MD Signature Date/Time: 04/23/2020/11:07:25 AM    Final    Culprit lesion: 100% distal RCA (PCI-Resolute Onyx DES 2.5 x 18--2.6 mm), 20% proximal RCA noted.  Proximal LAD 20% with mid LAD 50%.  Relatively small caliber OM branch 85 to 80% lesions -> per Dr. Tresa Endo, plan medical management.;;  Intervention     Patient Profile     Francisco Dodson is a 54 y.o. male smoker with no other significant past medical history who presented with an inferior STEMI - 100% RCA - DES PCI   Assessment & Plan    Principal Problem:   ST elevation myocardial infarction involving right coronary artery (HCC) Active Problems:   Coronary artery disease involving native coronary artery of native heart with unstable angina pectoris (HCC)   Presence of drug-eluting stent in right coronary artery   Hyperlipidemia with target LDL less than 70   Hypokalemia  1. Inferior STEMI/coronary disease of native coronary artery/presence of drug in RCA: The patient's TIMI risk score is 1, which indicates a 1.6% risk of all cause mortality at 30 days --> Successful RCA reperfusion with resolute Onyx DES.  Small OM 2 has residual disease with diffuse moderate LAD disease.  Treat medically.   Follow serial troponins.  Started on DAPT: Aspirin and Brilinta x1 year  Bivalirudin used in the calf, now completed  High-dose atorvastatin 80 mg daily.  Check lipid panel  and A1c.  Starting low-dose metoprolol tartrate 12.5 mg every 6 and consolidate once final dose established ((if covered by insurance without significant cost, would likely consolidate to Toprol -metoprolol succinate in the outpatient setting given his mildly reduced EF.)  2D echo pending on evaluation.  (Preliminary look at bedside images suggested EF mildly reduced at maybe 45%, inferior hypokinesis)  Smoking cessation counseling  [facility] consulted   2. Current tobacco use: Still  contemplative..  Nicotine patch provided  Continue to counsel while in hospital    3. Hypokalemia: K+ 2.6 on arrival here. Unclear etiology.  Has been getting both IV and oral supplementation-recheck labs this afternoon at 3.1.  Continue supplement  4. Transient bradycardia and hypotension: due to reperfusion of RCA territory, now resolved. -No further bradycardia on telemetry, okay to start low-dose beta-blocker   Nutrition:  Heart healthy diet DVT ppx:  Ambulate Advanced Care Planning:  Full code Disposition: Transfer to telemetry this afternoon.  Anticipate discharge on 04/25/2020   For questions or updates, please contact [facility] Please consult [email or web address] for contact info under        Signed, Bryan Lemma, MD  04/24/2020, 1:47 PM

## 2020-04-23 NOTE — Progress Notes (Signed)
CARDIAC REHAB PHASE I   PRE:  Rate/Rhythm: 72 SR  BP:  Supine: 106/76  Sitting:   Standing:    SaO2: 96%RA  MODE:  Ambulation: 370 ft   POST:  Rate/Rhythm: 90 SR  BP:  Supine:   Sitting: 104/71  Standing:    SaO2: 97%RA 1130-1224 Pt walked 370 ft on RA with steady gait after assessed by Dr Herbie Baltimore. Pt tolerated well with no CP. Encouraged pt not to overdo as he is very anxious to start walking and he feels good. Reviewed NTG use, MI restrictions, importance of brilinta with stent, smoking cessation and gave heart healthy diet. Pt given smoking cessation handout and encouraged to call 1800quitnow. Pt seems doubtful if he can quit or not. Discussed CRP 2 and referred to [facility] program. To recliner with call bell after walk.  Will complete ed tomorrow.   Luetta Nutting, RN BSN  04/23/2020 12:19 PM

## 2020-04-23 NOTE — Progress Notes (Signed)
  Echocardiogram 2D Echocardiogram has been performed.  Francisco Dodson 04/23/2020, 9:36 AM

## 2020-04-23 NOTE — Care Management (Signed)
Benefits check placed for Brilinta 90 mg po BID with TOC medical assistance for cost of medication for the patient.

## 2020-04-23 NOTE — H&P (Signed)
Cardiology History & Physical    Patient ID: Francisco Dodson MRN: [id number], DOB/AGE: [birth year]   Admit date: 04/23/2020  Primary Physician: No primary care provider on file. Primary Cardiologist: No primary care provider on file.  Patient Profile    Francisco Dodson is a 54 y.o. male smoker with no other significant past medical history who presented with an inferior STEMI.  History of Present Illness    Initially presented to [facility] with 2 days of chest pain that was fairly persistent. Initial vital signs were essentially normal. ECG showed inferior ST elevation so CODE STEMI was activated and the patient was transported emergently to our cath lab. Prior to transport he received full dose aspirin, heparin bolus + infusion, and nitro-paste. On arrival here chest pain was improving and mild. Coronary angiography revealed acute thrombotic occlusion of the distal RCA just beyond the marginal branch, diffuse moderate disease in the mid-distal LAD, and 85% ostial OM stenosis with 80% stenosis more distally. PCI successfully performed to distal RCA with 2.5 x 69mm Onyx DES with restoration of TIMI-3 flow. LVEDP 22. He required atropine and a fluid bolus for transient hypotension and bradycardia upon reperfusion, but subsequently transported to the CVICU. Brilinta loaded in cath lab. Bivalrudin utilized for the case due to ongoing chest pain for the last 2 days. Now chest-pain free. Denies any dyspnea or orthopnea.   Reports that he currently smokes and does not take any medications. No prior or recent history of abnormal bleeding. Does have strong family history of coronary disease but no personal medical problems he is aware of and no prior cardiac procedures or imaging.   Past Medical History   Past Medical History:  Diagnosis Date  . ATTENTION DEFICIT DISORDER, HX OF   . CHEST PAIN, ATYPICAL, HX OF   . DIZZINESS   . ELECTROCARDIOGRAM, ABNORMAL   . ST elevation myocardial infarction  involving right coronary artery (HCC)     No past surgical history on file.   Allergies Allergies  Allergen Reactions  . Penicillins Other (See Comments)    Almost killed patient     Home Medications    Prior to Admission medications   Medication Sig Start Date End Date Taking? Authorizing Provider  ibuprofen (ADVIL,MOTRIN) 200 MG tablet Take 200 mg by mouth every 6 (six) hours as needed for moderate pain.    [provider]    Family History    Family History  Problem Relation Age of Onset  . Heart attack Mother   . Heart disease Mother   . Heart attack Father   . Heart disease Father   . Heart disease Brother   . Stroke Brother   . Heart attack Brother        CABG x3   He indicated that his mother is deceased. He indicated that his father is alive. He indicated that his brother is alive.   Social History    Social History   Socioeconomic History  . Marital status: Single    Spouse name: Not on file  . Number of children: Not on file  . Years of education: Not on file  . Highest education level: Not on file  Occupational History  . Not on file  Tobacco Use  . Smoking status: Current Every Day Smoker    Packs/day: 1.00    Years: 10.00    Pack years: 10.00    Types: Cigarettes  Substance and Sexual Activity  . Alcohol use: No  .  Drug use: No  . Sexual activity: Never    Birth control/protection: None  Other Topics Concern  . Not on file  Social History Narrative  . Not on file   Social Determinants of Health   Financial Resource Strain:   . Difficulty of Paying Living Expenses:   Food Insecurity:   . Worried About Programme researcher, broadcasting/film/video in the Last Year:   . Barista in the Last Year:   Transportation Needs:   . Freight forwarder (Medical):   Marland Kitchen Lack of Transportation (Non-Medical):   Physical Activity:   . Days of Exercise per Week:   . Minutes of Exercise per Session:   Stress:   . Feeling of Stress :   Social Connections:    . Frequency of Communication with Friends and Family:   . Frequency of Social Gatherings with Friends and Family:   . Attends Religious Services:   . Active Member of Clubs or Organizations:   . Attends Banker Meetings:   Marland Kitchen Marital Status:   Intimate Partner Violence:   . Fear of Current or Ex-Partner:   . Emotionally Abused:   Marland Kitchen Physically Abused:   . Sexually Abused:      Review of Systems    A comprehensive review of systems was performed with pertinent positives and negatives noted in the HPI.  Physical Exam    BP 104/72   Pulse 71   Resp 17   Wt 84 kg   SpO2 98%   BMI 29.00 kg/m  General: Alert, NAD HEENT: Normal  Neck: No bruits or JVD. Lungs:  Resp regular and unlabored, CTA bilaterally. Heart: Regular rhythm, no s3, s4, or murmurs. Abdomen: Soft, non-tender, non-distended, BS +.  Extremities: Warm. No clubbing, cyanosis or edema. DP/PT/Radials 2+ and equal bilaterally. Psych: Normal affect. Neuro: Alert and oriented. No gross focal deficits. No abnormal movements.  Labs    Cardiac Panel (last 3 results) Recent Labs    04/23/20 0114  TROPONINIHS 1,459*    Lab Results  Component Value Date   WBC 12.6 (H) 04/23/2020   HGB 15.0 04/23/2020   HCT 43.8 04/23/2020   MCV 92.8 04/23/2020   PLT 227 04/23/2020    Recent Labs  Lab 04/23/20 0114  NA 137  K 2.6*  CL 101  CO2 27  BUN 17  CREATININE 1.19  CALCIUM 8.7*  PROT 6.5  BILITOT 0.6  ALKPHOS 70  ALT 18  AST 29  GLUCOSE 111*   Lab Results  Component Value Date   CHOL 166 04/23/2020   HDL 38 (L) 04/23/2020   LDLCALC 117 (H) 04/23/2020   TRIG 56 04/23/2020     Radiology Studies    CARDIAC CATHETERIZATION  Result Date: 04/23/2020  Prox RCA lesion is 20% stenosed.  Post intervention, there is a 0% residual stenosis.  Dist RCA lesion is 100% stenosed.  2nd Mrg-1 lesion is 85% stenosed.  2nd Mrg-2 lesion is 80% stenosed.  Mid LAD to Dist LAD lesion is 50% stenosed.   Dist LAD lesion is 50% stenosed.  Prox LAD lesion is 20% stenosed.  A stent was successfully placed.  Acute inferior wall myocardial infarction secondary to total occlusion of the distal RCA just beyond the acute margin with initial TIMI 0 flow. Multivessel CAD with luminal irregularity with narrowing up to 50% in the mid LAD; normal ramus immediate vessel; circumflex marginal stent stenosis in a small caliber vessel of 85% ostial and 80% in  the midsegment; and 20% proximal RCA narrowing with total occlusion proximal to the PDA takeoff. Successful PCI to the RCA with insertion of a 2.5 x 18 mm Resolute Onyx stent postdilated to 2.54 mm with the 100% occlusion reduced to 0% and restoration of TIMI-3 flow. Reperfusion associated transient hypotension requiring fluid bolus in addition to bradycardia treated with atropine 0.5 mg. RECOMMENDATION: DAPT for minimum of 12 months.  Will initiate atorvastatin 80 mg.  Initial medical therapy for concomitant CAD.  Smoking cessation is essential.  Plan 2D echo in a.m.    ECG & Cardiac Imaging    ECG: sinus rhythm with occasional PVC, inferior ST elevation with reciprocal depression in the lateral leads - personally reviewed.  Assessment & Plan    1. Inferior STEMI: successful reperfusion of culprit distal RCA territory with Onyx DES. He does also have residual obstructive disease of a relatively small OM2 and diffuse moderate LAD disease, which will be medically managed. Risk factors include smoking and family history. The patient's TIMI risk score is 1, which indicates a 1.6% risk of all cause mortality at 30 days. - Brilinta and aspirin for at least 1 year of DAPT - Bivalrudin to continue for additional 2 hours post PCI - Start atorvastatin 80mg  - Start low-dose metoprolol in the morning - TTE in the morning - A1c and lipid profile for risk stratification - Monitor serial troponins, telemetry, daily ECGs - Smoking cessation as noted below  2. Current  tobacco use: contemplative. - discussed smoking cessation, will need further counseling. - nicotine patch provided, starting in AM   3. Hypokalemia: K+ 2.6 on arrival here. Unclear etiology. - Replete with both PO and IV potassium - Repeat tomorrow and monitor for now  4. Transient bradycardia and hypotension: due to reperfusion of RCA territory, now resolved. - Monitor on telemetry  - Replete potassium - Hold off on start BB until the morning  Nutrition: Heart healthy diet DVT ppx: Can start enoxaparin in the morning Advanced Care Planning: Full Code Disposition: Admit to CICU, can probably transfer to the floor within next 24 hours   Signed, , MD 04/23/2020, 4:28 AM

## 2020-04-23 NOTE — TOC Benefit Eligibility Note (Signed)
Transition of Care [facility]) Benefit Eligibility Note    Patient Details  Name: Michale Emmerich MRN: [id number] Date of Birth: [birth year]   Medication/Dose: BRILINTA  90 MG BID  Covered?: Yes  Tier:  (TIER- 4 DRUG)  Prescription Coverage Preferred Pharmacy: [facility]  Spoke with Person/Company/Phone Number:: JENNIFER   @  [employer] #  [phone]  Co-Pay: 40 %  OF TOTAL COST  Prior Approval: No  Deductible: Unmet (OUT-OF-POCKET: Secundino Ginger Phone Number: 04/23/2020, 12:38 PM

## 2020-04-24 ENCOUNTER — Encounter (HOSPITAL_COMMUNITY): Payer: Self-pay | Admitting: Cardiovascular Disease

## 2020-04-24 DIAGNOSIS — E785 Hyperlipidemia, unspecified: Secondary | ICD-10-CM

## 2020-04-24 DIAGNOSIS — E876 Hypokalemia: Secondary | ICD-10-CM | POA: Diagnosis present

## 2020-04-24 DIAGNOSIS — Z720 Tobacco use: Secondary | ICD-10-CM

## 2020-04-24 DIAGNOSIS — Z955 Presence of coronary angioplasty implant and graft: Secondary | ICD-10-CM

## 2020-04-24 HISTORY — DX: Hyperlipidemia, unspecified: E78.5

## 2020-04-24 LAB — BASIC METABOLIC PANEL
Anion gap: 7 (ref 5–15)
BUN: 18 mg/dL (ref 6–20)
CO2: 22 mmol/L (ref 22–32)
Calcium: 8.2 mg/dL — ABNORMAL LOW (ref 8.9–10.3)
Chloride: 109 mmol/L (ref 98–111)
Creatinine, Ser: 1.01 mg/dL (ref 0.61–1.24)
GFR calc Af Amer: >60 mL/min (ref >60)
GFR calc non Af Amer: >60 mL/min (ref >60)
Glucose, Bld: 102 mg/dL — ABNORMAL HIGH (ref 70–99)
Potassium: 3.3 mmol/L — ABNORMAL LOW (ref 3.5–5.1)
Sodium: 138 mmol/L (ref 135–145)

## 2020-04-24 MED ORDER — NITROGLYCERIN 0.4 MG SL SUBL: 0.4000 mg | SUBLINGUAL_TABLET | SUBLINGUAL | 2 refills | Status: DC | PRN

## 2020-04-24 MED ORDER — TICAGRELOR 90 MG PO TABS: 90.0000 mg | ORAL_TABLET | Freq: Two times a day (BID) | ORAL | 5 refills | Status: DC

## 2020-04-24 MED ORDER — ASPIRIN 81 MG PO CHEW: 81.0000 mg | CHEWABLE_TABLET | Freq: Every day | ORAL | Status: DC

## 2020-04-24 MED ORDER — POTASSIUM CHLORIDE CRYS ER 20 MEQ PO TBCR
40.0000 meq | EXTENDED_RELEASE_TABLET | Freq: Two times a day (BID) | ORAL | Status: DC
  Administered 2020-04-24: 40 meq via ORAL
  Filled 2020-04-24: qty 2

## 2020-04-24 MED ORDER — METOPROLOL TARTRATE 25 MG PO TABS: 12.5000 mg | ORAL_TABLET | Freq: Two times a day (BID) | ORAL | 2 refills | Status: DC

## 2020-04-24 MED ORDER — POTASSIUM CHLORIDE CRYS ER 20 MEQ PO TBCR: 40.0000 meq | EXTENDED_RELEASE_TABLET | Freq: Once | ORAL | Status: AC

## 2020-04-24 MED ORDER — POTASSIUM CHLORIDE CRYS ER 20 MEQ PO TBCR: 40.0000 meq | EXTENDED_RELEASE_TABLET | Freq: Every day | ORAL | 2 refills | Status: DC

## 2020-04-24 MED ORDER — ATORVASTATIN CALCIUM 80 MG PO TABS: 80.0000 mg | ORAL_TABLET | Freq: Every day | ORAL | 2 refills | Status: DC

## 2020-04-24 MED ORDER — NITROGLYCERIN 0.4 MG SL SUBL: 0.4000 mg | SUBLINGUAL_TABLET | SUBLINGUAL | Status: DC | PRN

## 2020-04-24 MED FILL — POTASSIUM CHLORIDE 20meqER: 20 | 30 days supply | Qty: 60 | Fill #0

## 2020-04-24 MED FILL — ATORVASTATIN CALCIUM 80 MG: 80 | 30 days supply | Qty: 30 | Fill #0

## 2020-04-24 MED FILL — NITROGLYCERIN 0.4 MG TAB SL: 0.4 | 8 days supply | Qty: 25 | Fill #0

## 2020-04-24 MED FILL — METOPROLOL TARTRATE 25 MG T: 25 | 30 days supply | Qty: 30 | Fill #0

## 2020-04-24 MED FILL — BRILINTA 90 MG TABLET: 90 | 30 days supply | Qty: 60 | Fill #0

## 2020-04-24 NOTE — Progress Notes (Signed)
CARDIAC REHAB PHASE I   PRE:  Rate/Rhythm: 73 SR  BP:  Supine:   Sitting: 107/74  Standing:    SaO2 99%RA:   MODE:  Ambulation: 1120 ft   POST:  Rate/Rhythm: 85 SR  BP:  Supine:   Sitting: 109/77  Standing:    SaO2: 99%RA 1040-1103 Pt walked 1120 ft on RA with fast pace and no CP. Tolerated well. Pt wants to go home. Gave stent card and completed ex ed.  Back to recliner.    Luetta Nutting, RN BSN  04/24/2020 10:59 AM

## 2020-04-24 NOTE — Progress Notes (Signed)
Patient valuables were returned from security.  Patient acknowledged all cash was present and signed return copy of receipt.  Receipt was returned to patients shadow chart.  Discharge paperwork was given, all questions answered to patients satisfaction.  Malva Limes RN

## 2020-04-24 NOTE — Discharge Instructions (Signed)
Medication Changes: - START baby Aspirin (81mg ) daily and Brilinta 90mg  twice daily. These medications are very important because they help keep the stent in your heart open. - START Metoprolol tartrate 12.5mg  twice daily.  - START Atorvastatin (Lipitor) 80mg  daily for your cholesterol.  - START Potassium chloride daily.   Recommend stopping Ibuprofen (Advil) given increased risk for bleeding while on Aspirin and Brilinta. Recommend using Acetaminophen (Tylenol) as needed for pain instead.  Post STEMI: NO HEAVY LIFTING X 4 WEEKS. NO SEXUAL ACTIVITY X 4 WEEKS. NO DRIVING X 2 WEEKS. NO SOAKING BATHS, HOT TUBS, POOLS, ETC., X 7 DAYS.  Radial Site Care: Refer to this sheet in the next few weeks. These instructions provide you with information on caring for yourself after your procedure. Your caregiver may also give you more specific instructions. Your treatment has been planned according to current medical practices, but problems sometimes occur. Call your caregiver if you have any problems or questions after your procedure. HOME CARE INSTRUCTIONS  You may shower the day after the procedure.Remove the bandage (dressing) and gently wash the site with plain soap and water.Gently pat the site dry.   Do not apply powder or lotion to the site.   Do not submerge the affected site in water for 3 to 5 days.   Inspect the site at least twice daily.   Do not flex or bend the affected arm for 24 hours.   No lifting over 5 pounds (2.3 kg) for 5 days after your procedure.   Do not drive home if you are discharged the same day of the procedure. Have someone else drive you.  What to expect:  Any bruising will usually fade within 1 to 2 weeks.   Blood that collects in the tissue (hematoma) may be painful to the touch. It should usually decrease in size and tenderness within 1 to 2 weeks.  SEEK IMMEDIATE MEDICAL CARE IF:  You have unusual pain at the radial site.   You have redness,  warmth, swelling, or pain at the radial site.   You have drainage (other than a small amount of blood on the dressing).   You have chills.   You have a fever or persistent symptoms for more than 72 hours.   You have a fever and your symptoms suddenly get worse.   Your arm becomes pale, cool, tingly, or numb.   You have heavy bleeding from the site. Hold pressure on the site.

## 2020-04-24 NOTE — Discharge Summary (Addendum)
Discharge Summary    Patient ID: Francisco Dodson MRN: [id number]; DOB: 1966-06-29  Admit date: 04/23/2020 Discharge date: 04/24/2020  Primary Care Provider: No primary care provider on file.  Primary Cardiologist: Lives in [place] so will follow-up in [place] office. Primary Electrophysiologist:  None   Discharge Diagnoses    Principal Problem:   ST elevation myocardial infarction involving right coronary artery [facility]) Active Problems:   Coronary artery disease involving native coronary artery of native heart with unstable angina pectoris (HCC)   Presence of drug-eluting stent in right coronary artery   Hyperlipidemia with target LDL less than 70   Hypokalemia   Tobacco use    Diagnostic Studies/Procedures    Left Heart Catheterization 04/23/2020:  Prox RCA lesion is 20% stenosed.  Post intervention, there is a 0% residual stenosis.  Dist RCA lesion is 100% stenosed.  2nd Mrg-1 lesion is 85% stenosed.  2nd Mrg-2 lesion is 80% stenosed.  Mid LAD to Dist LAD lesion is 50% stenosed.  Dist LAD lesion is 50% stenosed.  Prox LAD lesion is 20% stenosed.  A stent was successfully placed.   Acute inferior wall myocardial infarction secondary to total occlusion of the distal RCA just beyond the acute margin with initial TIMI 0 flow.  Multivessel CAD with luminal irregularity with narrowing up to 50% in the mid LAD; normal ramus immediate vessel; circumflex marginal stent stenosis in a small caliber vessel of 85% ostial and 80% in the midsegment; and 20% proximal RCA narrowing with total occlusion proximal to the PDA takeoff.  Successful PCI to the RCA with insertion of a 2.5 x 18 mm Resolute Onyx stent postdilated to 2.54 mm with the 100% occlusion reduced to 0% and restoration of TIMI-3 flow.  Reperfusion associated transient hypotension requiring fluid bolus in addition to bradycardia treated with atropine 0.5 mg.  Recommendation: DAPT for minimum of 12 months.   Will initiate atorvastatin 80 mg.  Initial medical therapy for concomitant CAD.  Smoking cessation is essential.  Plan 2D echo in a.m. Diagnostic Dominance: Right  Intervention     _____________   Echocardiogram 04/23/2020: Impressions: 1. Left ventricular ejection fraction, by estimation, is 45 to 50%. The  left ventricle has mildly decreased function. The left ventricle  demonstrates regional wall motion abnormalities (see scoring  diagram/findings for description). Left ventricular  diastolic parameters were normal.  2. Right ventricular systolic function is normal. The right ventricular  size is normal. Tricuspid regurgitation signal is inadequate for assessing  PA pressure.  3. The mitral valve is grossly normal. No evidence of mitral valve  regurgitation. No evidence of mitral stenosis.  4. The aortic valve is tricuspid. Aortic valve regurgitation is not  visualized. No aortic stenosis is present.  5. The inferior vena cava is normal in size with greater than 50%  respiratory variability, suggesting right atrial pressure of 3 mmHg.   Conclusion(s)/Recommendation(s): Findings consistent with ischemic  cardiomyopathy.   History of Present Illness     Francisco Dodson is a 54 y.o. male with a history of current tobacco use but no other past medical history who presented with inferior STEMI on 04/23/2020. Patient initially presented to [facility] for further evaluation of fairly persistent chest pain that started 2 days prior. EKG showed inferior ST elevations. Code STEMI was called and patient was transported emergently to [facility] cath lab. Prior to transport, he received full dose Aspirin, Heparin bolus and infusion, and Nitro paste. Upon arrival to the cath lab, chest pain had improved  but was still present.  Patient reported that he currently smokes but is not on any medications at home. No prior or recent history of abnormal bleeding. Does have strong family history of  coronary disease but no personal medical problems he is aware of and no prior cardiac procedures or imaging.   Hospital Course     Consultants: None  Acute Inferior STEMI Patient admitted on 04/23/2020 with inferior STEMI as stated above. High-sensitivity troponin peaked at >27,000. Cardiac catheterization showed 100% stenosis of distal RCA, 85% stenosis followed by 80% stenosis of OM2, and non-obstructive disease in LAD. Patient underwent successful PCI with DES to RCA lesion. Echo showed LVEF 45-50% with inferior and posterior wall hypokinesis. Started on DAPT, beta-blocker, and high-intensity statin. Patient tolerated procedure well and did extremely well following procedure. He has been walking laps in the ICU today with no problems. Therefore, felt to be stable for discharge. Right radial cath site stable with no signs of hematoma. Renal function stable.  - Continue DAPT with Aspirin 81mg  daily and Brilinta 90mg  twice daily. Sent home with 1 month supply of Brilinta. Will likely have to convert to Plavix after 30 days due to financial concerns (he would need Plavix load of 300mg  on first day).  - Continue Lopressor 12.5mg  twice daily and Lipitor 80mg  daily.   Transient Bradycardia and Hypotension Patient had transient bradycardia and hypotension during cath requiring IV fluids and Atropine. Now resolved. No further bradycardia. BP soft at times but stable. Has tolerated low dose beta-blocker well so will continue at discharge.   Hyperlipidemia Lipid panel this admission: Total Cholesterol 166, Triglycerides 56, HDL 38, LDL 117. LDL goal <70 given CAD.  - Started on Lipitor 80mg  daily.  - Will need repeat lipid panel and LFTs in 6-8 weeks.   Hypokalemia Potassium 2.6 on arrival. Unclear etiology. Magnesium normal. Potassium repleted but still slightly low at 3.3 on day of discharge. Will discharge on potassium chloride 40 mEq daily. Will need repeat BMET at follow-up visit.   Tobacco  Use Importance of complete cessation was discussed during admission. Patient states he is determined to quit.  Patient seen and examined by Dr. today and determined to be stable for discharge. Outpatient follow-up has been arranged. Medications as below.     Did the patient have an acute coronary syndrome (MI, NSTEMI, STEMI, etc) this admission?:  Yes                               AHA/ACC Clinical Performance & Quality Measures: 1. Aspirin prescribed? - Yes 2. ADP Receptor Inhibitor (Plavix/Clopidogrel, Brilinta/Ticagrelor or Effient/Prasugrel) prescribed (includes medically managed patients)? - Yes 3. Beta Blocker prescribed? - Yes 4. High Intensity Statin (Lipitor 40-80mg  or Crestor 20-40mg ) prescribed? - Yes 5. EF assessed during THIS hospitalization? - Yes 6. For EF <40%, was ACEI/ARB prescribed? - Not Applicable (EF >/= 40%) 7. For EF <40%, Aldosterone Antagonist (Spironolactone or Eplerenone) prescribed? - Not Applicable (EF >/= 40%) 8. Cardiac Rehab Phase II ordered (including medically managed patients)? - Yes   _____________  Discharge Vitals Blood pressure 113/71, pulse 70, temperature 97.7 F (36.5 C), temperature source Oral, resp. rate 18, weight 86.5 kg, SpO2 99 %.  Filed Weights   04/23/20 0200 04/23/20 0500  Weight: 84 kg 86.5 kg    Labs & Radiologic Studies    CBC Recent Labs    04/23/20 0114 04/23/20 0114 04/23/20 0115 04/23/20 0518  WBC 12.6*  --   --  9.5  HGB 15.0   < > 15.3 13.8  HCT 43.8   < > 45.0 41.3  MCV 92.8  --   --  93.4  PLT 227  --   --  212   < > = values in this interval not displayed.   Basic Metabolic Panel Recent Labs    16/07/9606/20/21 0518 04/23/20 0518 04/23/20 1526 04/24/20 0224  NA 137  --   --  138  K 3.1*   < > 3.9 3.3*  CL 102  --   --  109  CO2 25  --   --  22  GLUCOSE 84  --   --  102*  BUN 16  --   --  18  CREATININE 1.10  --   --  1.01  CALCIUM 8.6*  --   --  8.2*  MG  --   --  2.0  --    < > = values in  this interval not displayed.   Liver Function Tests Recent Labs    04/23/20 0114  AST 29  ALT 18  ALKPHOS 70  BILITOT 0.6  PROT 6.5  ALBUMIN 3.1*   No results for input(s): LIPASE, AMYLASE in the last 72 hours. High Sensitivity Troponin:   Recent Labs  Lab 04/23/20 0114 04/23/20 0518  TROPONINIHS 1,459* >27,000*    BNP Invalid input(s): POCBNP D-Dimer No results for input(s): DDIMER in the last 72 hours. Hemoglobin A1C Recent Labs    04/23/20 0114  HGBA1C 5.4   Fasting Lipid Panel Recent Labs    04/23/20 0114  CHOL 166  HDL 38*  LDLCALC 117*  TRIG 56  CHOLHDL 4.4   Thyroid Function Tests No results for input(s): TSH, T4TOTAL, T3FREE, THYROIDAB in the last 72 hours.  Invalid input(s): FREET3 _____________  CARDIAC CATHETERIZATION  Result Date: 04/23/2020  Prox RCA lesion is 20% stenosed.  Post intervention, there is a 0% residual stenosis.  Dist RCA lesion is 100% stenosed.  2nd Mrg-1 lesion is 85% stenosed.  2nd Mrg-2 lesion is 80% stenosed.  Mid LAD to Dist LAD lesion is 50% stenosed.  Dist LAD lesion is 50% stenosed.  Prox LAD lesion is 20% stenosed.  A stent was successfully placed.  Acute inferior wall myocardial infarction secondary to total occlusion of the distal RCA just beyond the acute margin with initial TIMI 0 flow. Multivessel CAD with luminal irregularity with narrowing up to 50% in the mid LAD; normal ramus immediate vessel; circumflex marginal stent stenosis in a small caliber vessel of 85% ostial and 80% in the midsegment; and 20% proximal RCA narrowing with total occlusion proximal to the PDA takeoff. Successful PCI to the RCA with insertion of a 2.5 x 18 mm Resolute Onyx stent postdilated to 2.54 mm with the 100% occlusion reduced to 0% and restoration of TIMI-3 flow. Reperfusion associated transient hypotension requiring fluid bolus in addition to bradycardia treated with atropine 0.5 mg. RECOMMENDATION: DAPT for minimum of 12 months.  Will  initiate atorvastatin 80 mg.  Initial medical therapy for concomitant CAD.  Smoking cessation is essential.  Plan 2D echo in a.m.   ECHOCARDIOGRAM COMPLETE  Result Date: 04/23/2020    ECHOCARDIOGRAM REPORT   Patient Name:   Francisco Dodson Date of Exam: 04/23/2020 Medical Rec #:  [id number]  Height:       67.0 in Accession #:    [id number] Weight:       190.7 lb  Date of Birth:  1966-04-03  BSA:          1.982 m Patient Age:    53 years   BP:           96/61 mmHg Patient Gender: M          HR:           63 bpm. Exam Location:  Inpatient Procedure: 2D Echo, Cardiac Doppler and Color Doppler Indications:    Acute Myocardial Infarction 410  History:        Patient has no prior history of Echocardiogram examinations.                 CAD; Signs/Symptoms:Chest Pain.  Sonographer:    Elmarie Shiley Dance Referring Phys: [id number] THOMAS A KELLY IMPRESSIONS  1. Left ventricular ejection fraction, by estimation, is 45 to 50%. The left ventricle has mildly decreased function. The left ventricle demonstrates regional wall motion abnormalities (see scoring diagram/findings for description). Left ventricular diastolic parameters were normal.  2. Right ventricular systolic function is normal. The right ventricular size is normal. Tricuspid regurgitation signal is inadequate for assessing PA pressure.  3. The mitral valve is grossly normal. No evidence of mitral valve regurgitation. No evidence of mitral stenosis.  4. The aortic valve is tricuspid. Aortic valve regurgitation is not visualized. No aortic stenosis is present.  5. The inferior vena cava is normal in size with greater than 50% respiratory variability, suggesting right atrial pressure of 3 mmHg. Conclusion(s)/Recommendation(s): Findings consistent with ischemic cardiomyopathy. FINDINGS  Left Ventricle: Left ventricular ejection fraction, by estimation, is 45 to 50%. The left ventricle has mildly decreased function. The left ventricle demonstrates regional wall motion abnormalities.  The left ventricular internal cavity size was normal in size. There is no left ventricular hypertrophy. Left ventricular diastolic parameters were normal.  LV Wall Scoring: The inferior wall and posterior wall are hypokinetic. Right Ventricle: The right ventricular size is normal. No increase in right ventricular wall thickness. Right ventricular systolic function is normal. Tricuspid regurgitation signal is inadequate for assessing PA pressure. Left Atrium: Left atrial size was normal in size. Right Atrium: Right atrial size was normal in size. Pericardium: Trivial pericardial effusion is present. Mitral Valve: The mitral valve is grossly normal. No evidence of mitral valve regurgitation. No evidence of mitral valve stenosis. Tricuspid Valve: The tricuspid valve is grossly normal. Tricuspid valve regurgitation is not demonstrated. No evidence of tricuspid stenosis. Aortic Valve: The aortic valve is tricuspid. Aortic valve regurgitation is not visualized. No aortic stenosis is present. Pulmonic Valve: The pulmonic valve was grossly normal. Pulmonic valve regurgitation is not visualized. No evidence of pulmonic stenosis. Aorta: The aortic root and ascending aorta are structurally normal, with no evidence of dilitation. Venous: The inferior vena cava is normal in size with greater than 50% respiratory variability, suggesting right atrial pressure of 3 mmHg. IAS/Shunts: The atrial septum is grossly normal.  LEFT VENTRICLE PLAX 2D LVIDd:         4.80 cm  Diastology LVIDs:         3.70 cm  LV e' lateral:   10.10 cm/s LV PW:         1.10 cm  LV E/e' lateral: 8.5 LV IVS:        1.10 cm  LV e' medial:    9.46 cm/s LVOT diam:     1.90 cm  LV E/e' medial:  9.0 LV SV:         49 LV  SV Index:   25 LVOT Area:     2.84 cm  RIGHT VENTRICLE            IVC RV Basal diam:  2.50 cm    IVC diam: 1.80 cm RV S prime:     9.36 cm/s TAPSE (M-mode): 1.7 cm LEFT ATRIUM             Index       RIGHT ATRIUM           Index LA diam:         4.10 cm 2.07 cm/m  RA Area:     16.90 cm LA Vol (A2C):   42.9 ml 21.65 ml/m RA Volume:   47.00 ml  23.72 ml/m LA Vol (A4C):   28.7 ml 14.48 ml/m LA Biplane Vol: 35.7 ml 18.01 ml/m  AORTIC VALVE LVOT Vmax:   96.80 cm/s LVOT Vmean:  56.200 cm/s LVOT VTI:    0.174 m  AORTA Ao Root diam: 3.30 cm Ao Asc diam:  2.70 cm MITRAL VALVE MV Area (PHT): 3.60 cm    SHUNTS MV Decel Time: 211 msec    Systemic VTI:  0.17 m MV E velocity: 85.40 cm/s  Systemic Diam: 1.90 cm MV A velocity: 72.30 cm/s MV E/A ratio:  1.18 Lennie Odor MD Electronically signed by Lennie Odor MD Signature Date/Time: 04/23/2020/11:07:25 AM    Final    Disposition   Patient is being discharged home today in good condition.  Follow-up Plans & Appointments     Follow-up Information    Netta Neat., NP Follow up.   Specialty: Cardiology Why: Hospital follow-up scheduled for 05/03/2020 on 9:00am with Nena Polio, one of the NP's in our office. Please arrive 15 minutes early for check-in. If this date/time does not work for you, please call our office to reschedule. Contact information: [address] [place] [address] [phone]              Discharge Instructions    Amb Referral to Cardiac Rehabilitation   Complete by: As directed    Diagnosis:  STEMI Coronary Stents     After initial evaluation and assessments completed: Virtual Based Care may be provided alone or in conjunction with Phase 2 Cardiac Rehab based on patient barriers.: Yes   Diet - low sodium heart healthy   Complete by: As directed    Increase activity slowly   Complete by: As directed       Discharge Medications   Allergies as of 04/24/2020      Reactions   Penicillins Other (See Comments)   Almost killed patient       Medication List    STOP taking these medications   ibuprofen 200 MG tablet Commonly known as: ADVIL     TAKE these medications   aspirin 81 MG chewable tablet Chew 1 tablet (81 mg total) by mouth  daily. Start taking on: April 25, 2020   atorvastatin 80 MG tablet Commonly known as: LIPITOR Take 1 tablet (80 mg total) by mouth daily. Start taking on: April 25, 2020   metoprolol tartrate 25 MG tablet Commonly known as: LOPRESSOR Take 0.5 tablets (12.5 mg total) by mouth 2 (two) times daily.   nitroGLYCERIN 0.4 MG SL tablet Commonly known as: NITROSTAT Place 1 tablet (0.4 mg total) under the tongue every 5 (five) minutes as needed for chest pain.   potassium chloride SA 20 MEQ tablet Commonly known as: KLOR-CON Take  2 tablets (40 mEq total) by mouth daily.   ticagrelor 90 MG Tabs tablet Commonly known as: BRILINTA Take 1 tablet (90 mg total) by mouth 2 (two) times daily.          Outstanding Labs/Studies   Repeat BMET at follow-up visit. Repeat lipid panel and LFTs in 6-8 weeks.  Duration of Discharge Encounter   Greater than 30 minutes including physician time.  Signed, Bryan Lemma, MD 04/24/2020, 9:04 PM  ATTENDING ATTESTATION  I have seen, examined and evaluated the patient this morning-see independent rounding note.  After reviewing all the available data and chart, we discussed the patients laboratory, study & physical findings as well as symptoms in detail. I agree with the findings, examination as well as impression, summary, and recommendations as per our discussion.    Attending adjustments noted in italics.     Bryan Lemma, M.D., M.S. Interventional Cardiologist   Pager # [phone] Phone # [phone] [address] [place], [place] [address]

## 2020-04-24 NOTE — Progress Notes (Signed)
Progress Note  Patient Name: Francisco Dodson Date of Encounter: 04/24/2020  [facility] Cardiologist: Nicki Guadalajara, MD - Dr. Tresa Endo  Subjective   Feels great today.  Has been up and running laps around the nursing unit.  No chest pain or pressure.  No dyspnea. Would like to go home.  Inpatient Medications    Scheduled Meds: . aspirin  81 mg Oral Daily  . atorvastatin  80 mg Oral Daily  . Chlorhexidine Gluconate Cloth  6 each Topical Daily  . metoprolol tartrate  12.5 mg Oral BID  . potassium chloride  40 mEq Oral BID  . sodium chloride flush  3 mL Intravenous Q12H  . ticagrelor  90 mg Oral BID   Continuous Infusions: . sodium chloride 10 mL/hr at 04/24/20 0000  . sodium chloride     PRN Meds: sodium chloride, acetaminophen, diazepam, ondansetron (ZOFRAN) IV, sodium chloride flush, zolpidem   Vital Signs    Vitals:   04/24/20 0900 04/24/20 1000 04/24/20 1100 04/24/20 1118  BP: 103/67  113/71   Pulse: 66 80 70   Resp: Temp:    97.7 F (36.5 C)  TempSrc:    Oral  SpO2: 98% 99% 99%   Weight:        Intake/Output Summary (Last 24 hours) at 04/24/2020 1327 Last data filed at 04/24/2020 1200 Gross per 24 hour  Intake 2268.31 ml  Output 760 ml  Net 1508.31 ml   Last 3 Weights 04/23/2020 04/23/2020 01/25/2016  Weight (lbs) 190 lb 11.2 oz 185 lb 3 oz 180 lb  Weight (kg) 86.5 kg 84 kg 81.647 kg      Telemetry    Sinus rhythm- Personally Reviewed  ECG    No new EKG.- Personally Reviewed  Physical Exam   Physical Exam Constitutional:      General: He is not in acute distress.    Appearance: Normal appearance. He is normal weight. He is not ill-appearing.     Comments: Healthy-appearing.  Looks great  HENT:     Head: Normocephalic and atraumatic.  Neck:     Vascular: No carotid bruit.  Cardiovascular:     Rate and Rhythm: Normal rate and regular rhythm.     Pulses: Normal pulses.     Heart sounds: Normal heart sounds. No murmur heard.  No  friction rub. No gallop.   Pulmonary:     Effort: Pulmonary effort is normal. No respiratory distress.     Breath sounds: Normal breath sounds.  Musculoskeletal:        General: No swelling.     Cervical back: Normal range of motion.     Comments: Radial cath site with no hematoma.  Dressing off  Neurological:     General: No focal deficit present.     Mental Status: He is alert and oriented to person, place, and time. Mental status is at baseline.  Psychiatric:        Mood and Affect: Mood normal.        Behavior: Behavior normal.        Thought Content: Thought content normal.        Judgment: Judgment normal.      Labs    High Sensitivity Troponin:   Recent Labs  Lab 04/23/20 0114 04/23/20 0518  TROPONINIHS 1,459* >27,000*      Chemistry Recent Labs  Lab 04/23/20 0114 04/23/20 0114 04/23/20 0115 04/23/20 0115 04/23/20 0518 04/23/20 1526 04/24/20 0224  NA 137   < >  141  --  137  --  138  K 2.6*   < > 2.8*   < > 3.1* 3.9 3.3*  CL 101   < > 97*  --  102  --  109  CO2 27  --   --   --  25  --  22  GLUCOSE 111*   < > 112*  --  84  --  102*  BUN 17   < > 18  --  16  --  18  CREATININE 1.19   < > 1.10  --  1.10  --  1.01  CALCIUM 8.7*  --   --   --  8.6*  --  8.2*  PROT 6.5  --   --   --   --   --   --   ALBUMIN 3.1*  --   --   --   --   --   --   AST 29  --   --   --   --   --   --   ALT 18  --   --   --   --   --   --   ALKPHOS 70  --   --   --   --   --   --   BILITOT 0.6  --   --   --   --   --   --   GFRNONAA >60  --   --   --  >60  --  >60  GFRAA >60  --   --   --  >60  --  >60  ANIONGAP 9  --   --   --  10  --  7   < > = values in this interval not displayed.     Hematology Recent Labs  Lab 04/23/20 0114 04/23/20 0115 04/23/20 0518  WBC 12.6*  --  9.5  RBC 4.72  --  4.42  HGB 15.0 15.3 13.8  HCT 43.8 45.0 41.3  MCV 92.8  --  93.4  MCH 31.8  --  31.2  MCHC 34.2  --  33.4  RDW 13.2  --  13.2  PLT 227  --  212    BNPNo results for input(s):  BNP, PROBNP in the last 168 hours.   DDimer No results for input(s): DDIMER in the last 168 hours.   Radiology  - Cardiac Studies    CARDIAC CATHETERIZATION  Result Date: 04/23/2020  Prox RCA lesion is 20% stenosed.  Post intervention, there is a 0% residual stenosis.  Dist RCA lesion is 100% stenosed.  2nd Mrg-1 lesion is 85% stenosed.  2nd Mrg-2 lesion is 80% stenosed.  Mid LAD to Dist LAD lesion is 50% stenosed.  Dist LAD lesion is 50% stenosed.  Prox LAD lesion is 20% stenosed.  A stent was successfully placed.  Acute inferior wall myocardial infarction secondary to total occlusion of the distal RCA just beyond the acute margin with initial TIMI 0 flow. Multivessel CAD with luminal irregularity with narrowing up to 50% in the mid LAD; normal ramus immediate vessel; circumflex marginal stent stenosis in a small caliber vessel of 85% ostial and 80% in the midsegment; and 20% proximal RCA narrowing with total occlusion proximal to the PDA takeoff. Successful PCI to the RCA with insertion of a 2.5 x 18 mm Resolute Onyx stent postdilated to 2.54 mm with the 100% occlusion reduced to 0% and restoration of TIMI-3 flow. Reperfusion  associated transient hypotension requiring fluid bolus in addition to bradycardia treated with atropine 0.5 mg. RECOMMENDATION: DAPT for minimum of 12 months.  Will initiate atorvastatin 80 mg.  Initial medical therapy for concomitant CAD.  Smoking cessation is essential.  Plan 2D echo in a.m.   ECHOCARDIOGRAM COMPLETE  Result Date: 04/23/2020    ECHOCARDIOGRAM REPORT   Patient Name:   Francisco Dodson Date of Exam: 04/23/2020 Medical Rec #:  [id number]  Height:       67.0 in Accession #:    [id number] Weight:       190.7 lb Date of Birth:  Sep 17, 1966  BSA:          1.982 m Patient Age:    53 years   BP:           96/61 mmHg Patient Gender: M          HR:           63 bpm. Exam Location:  Inpatient Procedure: 2D Echo, Cardiac Doppler and Color Doppler Indications:    Acute  Myocardial Infarction 410  History:        Patient has no prior history of Echocardiogram examinations.                 CAD; Signs/Symptoms:Chest Pain.  Sonographer:    Elmarie Shileyiffany Dance Referring Phys: [id number] THOMAS A KELLY IMPRESSIONS  1. Left ventricular ejection fraction, by estimation, is 45 to 50%. The left ventricle has mildly decreased function. The left ventricle demonstrates regional wall motion abnormalities (see scoring diagram/findings for description). Left ventricular diastolic parameters were normal.  2. Right ventricular systolic function is normal. The right ventricular size is normal. Tricuspid regurgitation signal is inadequate for assessing PA pressure.  3. The mitral valve is grossly normal. No evidence of mitral valve regurgitation. No evidence of mitral stenosis.  4. The aortic valve is tricuspid. Aortic valve regurgitation is not visualized. No aortic stenosis is present.  5. The inferior vena cava is normal in size with greater than 50% respiratory variability, suggesting right atrial pressure of 3 mmHg. Conclusion(s)/Recommendation(s): Findings consistent with ischemic cardiomyopathy. FINDINGS  Left Ventricle: Left ventricular ejection fraction, by estimation, is 45 to 50%. The left ventricle has mildly decreased function. The left ventricle demonstrates regional wall motion abnormalities. The left ventricular internal cavity size was normal in size. There is no left ventricular hypertrophy. Left ventricular diastolic parameters were normal.  LV Wall Scoring: The inferior wall and posterior wall are hypokinetic. Right Ventricle: The right ventricular size is normal. No increase in right ventricular wall thickness. Right ventricular systolic function is normal. Tricuspid regurgitation signal is inadequate for assessing PA pressure. Left Atrium: Left atrial size was normal in size. Right Atrium: Right atrial size was normal in size. Pericardium: Trivial pericardial effusion is present. Mitral  Valve: The mitral valve is grossly normal. No evidence of mitral valve regurgitation. No evidence of mitral valve stenosis. Tricuspid Valve: The tricuspid valve is grossly normal. Tricuspid valve regurgitation is not demonstrated. No evidence of tricuspid stenosis. Aortic Valve: The aortic valve is tricuspid. Aortic valve regurgitation is not visualized. No aortic stenosis is present. Pulmonic Valve: The pulmonic valve was grossly normal. Pulmonic valve regurgitation is not visualized. No evidence of pulmonic stenosis. Aorta: The aortic root and ascending aorta are structurally normal, with no evidence of dilitation. Venous: The inferior vena cava is normal in size with greater than 50% respiratory variability, suggesting right atrial pressure of 3 mmHg. IAS/Shunts: The atrial septum is  grossly normal.  LEFT VENTRICLE PLAX 2D LVIDd:         4.80 cm  Diastology LVIDs:         3.70 cm  LV e' lateral:   10.10 cm/s LV PW:         1.10 cm  LV E/e' lateral: 8.5 LV IVS:        1.10 cm  LV e' medial:    9.46 cm/s LVOT diam:     1.90 cm  LV E/e' medial:  9.0 LV SV:         49 LV SV Index:   25 LVOT Area:     2.84 cm  RIGHT VENTRICLE            IVC RV Basal diam:  2.50 cm    IVC diam: 1.80 cm RV S prime:     9.36 cm/s TAPSE (M-mode): 1.7 cm LEFT ATRIUM             Index       RIGHT ATRIUM           Index LA diam:        4.10 cm 2.07 cm/m  RA Area:     16.90 cm LA Vol (A2C):   42.9 ml 21.65 ml/m RA Volume:   47.00 ml  23.72 ml/m LA Vol (A4C):   28.7 ml 14.48 ml/m LA Biplane Vol: 35.7 ml 18.01 ml/m  AORTIC VALVE LVOT Vmax:   96.80 cm/s LVOT Vmean:  56.200 cm/s LVOT VTI:    0.174 m  AORTA Ao Root diam: 3.30 cm Ao Asc diam:  2.70 cm MITRAL VALVE MV Area (PHT): 3.60 cm    SHUNTS MV Decel Time: 211 msec    Systemic VTI:  0.17 m MV E velocity: 85.40 cm/s  Systemic Diam: 1.90 cm MV A velocity: 72.30 cm/s MV E/A ratio:  1.18 Lennie Odor MD Electronically signed by Lennie Odor MD Signature Date/Time: 04/23/2020/11:07:25 AM     Final    Intervention     04/23/2020 - TTE: EF 45-50% inferior and posterior hypokinesis.  Normal diastolic pattern.  Essentially normal valves.  Normal RV.  Normal atrial sizes.  Otherwise normal echo besides   Patient Profile     Francisco Dodson is a 54 y.o. 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 Inferior STEMI/coronary disease of native coronary artery/presence of drug in RCA: The patient's TIMI risk score is 1, which indicates a 1.6% risk of all cause mortality at 30 days --> Successful RCA reperfusion with resolute Onyx DES.  Small OM 2 has residual disease with diffuse moderate LAD disease.  Treat medically.   Troponins did peak at a greater than 27,000 which is the max recorded  Plan DAPT times minimum 1 year - > will send home on 1 month worth of Brilinta and then likely convert to Plavix after 30 days due to financial concerns (would need 300 mg load on first day of Plavix)  High-dose atorvastatin 80 mg daily.  LDL one 13-1 17.  A1c at 5.4.   Consolidate to metoprolol 12 mg twice daily.  Not able to tolerate further  Echo completed (reviewed above).  (Preliminary look at bedside images suggedosing sted EF mildly reduced at maybe 45%, inferior hypokinesis) : Formal reads at 45 to 50%.  Inferior and posterior wall hypokinesis   Smoking cessation counseling -> he said he is ready to quit  [facility] consulted -> running laps around the unit   2. Current tobacco use: Now has determined to quit smoking.    3. Hypokalemia: K+ 2.6 on arrival here. Unclear  etiology.  Potassium level was 3.2 this morning.  Will continue oral load and discharged home on 40 mill equivalent K. Dur  4. Transient bradycardia and hypotension: due to reperfusion of RCA territory, now resolved. No further bradycardia noted.  Tolerating very low-dose beta-blocker.   Nutrition:  Heart healthy diet  Advanced Care Planning:  Code Disposition: Did not transfer out of the ICU because of bed issues, but was doing so well today that I think he is ready for discharge today.     For questions or updates, please contact [facility] Please consult [email or web address] for contact info under        Signed, Bryan Lemma, MD  04/24/2020, 1:27 PM

## 2020-05-02 ENCOUNTER — Encounter: Payer: Self-pay | Admitting: *Deleted

## 2020-05-02 NOTE — Progress Notes (Deleted)
Cardiology Office Note  Date: 05/02/2020   ID: Francisco Dodson, DOB 04-May-1966, MRN [id number]  PCP:  No primary care provider on file.  Cardiologist:  No primary care provider on file. Electrophysiologist:  None   Chief Complaint: Hospital follow-up inferior STEMI  History of Present Illness: Francisco Dodson is a 54 y.o. male with a history of History of atypical chest pain, smoking, strong family history of coronary artery disease.  Recent inferior STEMI   Patient initially presented to [facility] with 2 days of chest pain.  EKG showed inferior ST elevation.  Code STEMI was activated.  He was transported emergently to [facility] Cath Lab.   Cardiac catheterization showed acute thrombotic occlusion of distal RCA beyond marginal branch, diffuse disease and removed past distal LAD and an 85% ostial OM stenosis with 80% stenosis more distally.  PCI to distal RCA with DES with restoration of TIMI III flow.  Had transient hypotension and bradycardia receiving atropine panel fluid bolus.  Started on Brilinta and aspirin DAPT therapy for at least 1 year.  Atorvastatin 80 mg daily.  He was hypokalemic as an inpatient and was repleted with IV and p.o. potassium.  He was to start a beta-blocker morning following the PCI.  Past Medical History:  Diagnosis Date  . ATTENTION DEFICIT DISORDER, HX OF   . CHEST PAIN, ATYPICAL, HX OF   . Coronary artery disease involving native coronary artery of native heart with unstable angina pectoris (HCC) 04/23/2020   Multivessel CAD with luminal irregularity with narrowing up to 50% in the mid LAD; normal ramus immediate vessel; circumflex marginal stent stenosis in a small caliber vessel of 85% ostial and 80% in the midsegment; and 20% proximal RCA narrowing with total occlusion proximal to the PDA takeoff. - DES PCI of RCA, ~ med Rx of LCx-OM  . DIZZINESS   . ELECTROCARDIOGRAM, ABNORMAL   . Hyperlipidemia with target LDL less than 70 04/24/2020  . ST elevation  myocardial infarction involving right coronary artery (HCC) 04/23/2020   PCI to the RCA with insertion of a 2.5 x 18 mm Resolute Onyx stent postdilated to 2.54 mm with the 100% occlusion reduced to 0% and restoration of TIMI-3 flow.    Past Surgical History:  Procedure Laterality Date  . CORONARY/GRAFT ACUTE MI REVASCULARIZATION N/A 04/23/2020   Procedure: Coronary/Graft Acute MI Revascularization;  Surgeon: Lennette Bihari, MD;  Location: [facility] INVASIVE CV LAB;  Service: Cardiovascular; Culprit lesion: 100% distal RCA (PCI-Resolute Onyx DES 2.5 x 18--2.6 mm)  . LEFT HEART CATH AND CORONARY ANGIOGRAPHY N/A 04/23/2020   Procedure: LEFT HEART CATH AND CORONARY ANGIOGRAPHY;  Surgeon: Lennette Bihari, MD;  Location: [facility] INVASIVE CV LAB;  Service: Cardiovascular;  INF STEMI: CULPRIT 100% dRCA (DES PCI), 20% proximal RCA noted.  Proximal LAD 20% with mid LAD 50%.  Relatively small caliber OM branch 85 to 80% lesions -> per Dr. Tresa Endo, plan medical management    Current Outpatient Medications  Medication Sig Dispense Refill  . aspirin 81 MG chewable tablet Chew 1 tablet (81 mg total) by mouth daily.    Marland Kitchen atorvastatin (LIPITOR) 80 MG tablet Take 1 tablet (80 mg total) by mouth daily. 30 tablet 2  . metoprolol tartrate (LOPRESSOR) 25 MG tablet Take 0.5 tablets (12.5 mg total) by mouth 2 (two) times daily. 30 tablet 2  . nitroGLYCERIN (NITROSTAT) 0.4 MG SL tablet Place 1 tablet (0.4 mg total) under the tongue every 5 (five) minutes as needed for chest pain. 25  tablet 2  . potassium chloride SA (KLOR-CON) 20 MEQ tablet Take 2 tablets (40 mEq total) by mouth daily. 60 tablet 2  . ticagrelor (BRILINTA) 90 MG TABS tablet Take 1 tablet (90 mg total) by mouth 2 (two) times daily. 60 tablet 5   No current facility-administered medications for this visit.   Allergies:  Penicillins   Social History: The patient  reports that he has been smoking cigarettes. He has a 10.00 pack-year smoking history. He does not have  any smokeless tobacco history on file. He reports that he does not drink alcohol and does not use drugs.   Family History: The patient's family history includes Heart attack in his brother, father, and mother; Heart disease in his brother, father, and mother; Stroke in his brother.   ROS:  Please see the history of present illness. Otherwise, complete review of systems is positive for none.  All other systems are reviewed and negative.   Physical Exam: VS:  There were no vitals taken for this visit., BMI There is no height or weight on file to calculate BMI.  Wt Readings from Last 3 Encounters:  04/23/20 190 lb 11.2 oz (86.5 kg)  01/25/16 180 lb (81.6 kg)  10/06/15 180 lb (81.6 kg)    General: Patient appears comfortable at rest. HEENT: Conjunctiva and lids normal, oropharynx clear with moist mucosa. Neck: Supple, no elevated JVP or carotid bruits, no thyromegaly. Lungs: Clear to auscultation, nonlabored breathing at rest. Cardiac: Regular rate and rhythm, no S3 or significant systolic murmur, no pericardial rub. Abdomen: Soft, nontender, no hepatomegaly, bowel sounds present, no guarding or rebound. Extremities: No pitting edema, distal pulses 2+. Skin: Warm and dry. Musculoskeletal: No kyphosis. Neuropsychiatric: Alert and oriented x3, affect grossly appropriate.  ECG:  {EKG/Telemetry Strips Reviewed:[phone]}  Recent Labwork: 04/23/2020: ALT 18; AST 29; Hemoglobin 13.8; Magnesium 2.0; Platelets 212 04/24/2020: BUN 18; Creatinine, Ser 1.01; Potassium 3.3; Sodium 138     Component Value Date/Time   CHOL 166 04/23/2020 0114   TRIG 56 04/23/2020 0114   TRIG 72 10/10/2004 0000   HDL 38 (L) 04/23/2020 0114   CHOLHDL 4.4 04/23/2020 0114   VLDL 11 04/23/2020 0114   LDLCALC 117 (H) 04/23/2020 0114   LDLDIRECT 153 (H) 02/05/2012 1545    Other Studies Reviewed Today:  04/23/2020 Cardiac catheterization Coronary/Graft Acute MI Revascularization  LEFT HEART CATH AND CORONARY  ANGIOGRAPHY  Conclusion    Prox RCA lesion is 20% stenosed.  Post intervention, there is a 0% residual stenosis.  Dist RCA lesion is 100% stenosed.  2nd Mrg-1 lesion is 85% stenosed.  2nd Mrg-2 lesion is 80% stenosed.  Mid LAD to Dist LAD lesion is 50% stenosed.  Dist LAD lesion is 50% stenosed.  Prox LAD lesion is 20% stenosed.  A stent was successfully placed.   Acute inferior wall myocardial infarction secondary to total occlusion of the distal RCA just beyond the acute margin with initial TIMI 0 flow.  Multivessel CAD with luminal irregularity with narrowing up to 50% in the mid LAD; normal ramus immediate vessel; circumflex marginal stent stenosis in a small caliber vessel of 85% ostial and 80% in the midsegment; and 20% proximal RCA narrowing with total occlusion proximal to the PDA takeoff.  Successful PCI to the RCA with insertion of a 2.5 x 18 mm Resolute Onyx stent postdilated to 2.54 mm with the 100% occlusion reduced to 0% and restoration of TIMI-3 flow.  Reperfusion associated transient hypotension requiring fluid bolus in addition to  bradycardia treated with atropine 0.5 mg.  RECOMMENDATION: DAPT for minimum of 12 months.  Will initiate atorvastatin 80 mg.  Initial medical therapy for concomitant CAD.  Smoking cessation is essential.  Plan 2D echo in a.m. Diagnostic Dominance: Right  Intervention     Echocardiogram 04/23/2020  1. Left ventricular ejection fraction, by estimation, is 45 to 50%. The left ventricle has mildly decreased function. The left ventricle demonstrates regional wall motion abnormalities (see scoring diagram/findings for description). Left ventricular diastolic parameters were normal. 2. Right ventricular systolic function is normal. The right ventricular size is normal. Tricuspid regurgitation signal is inadequate for assessing PA pressure. 3. The mitral valve is grossly normal. No evidence of mitral valve regurgitation. No  evidence of mitral stenosis. 4. The aortic valve is tricuspid. Aortic valve regurgitation is not visualized. No aortic stenosis is present. 5. The inferior vena cava is normal in size with greater than 50% respiratory variability, suggesting right atrial pressure of 3 mmHg. Conclusion(s)/Recommendation(s): Findings consistent with ischemic cardiomyopathy   Assessment and Plan:  1. CAD in native artery   2. ST elevation myocardial infarction involving right coronary artery (HCC)   3. Ischemic cardiomyopathy   4. Hyperlipidemia with target LDL less than 70   5. Smoking    1. CAD in native artery ***  2. ST elevation myocardial infarction involving right coronary artery (HCC) ***  3. Ischemic cardiomyopathy ***  4. Hyperlipidemia with target LDL less than 70 ***  5. Smoking ***  Medication Adjustments/Labs and Tests Ordered: Current medicines are reviewed at length with the patient today.  Concerns regarding medicines are outlined above.   Disposition: Follow-up with ***  Signed, Rennis Harding, NP 05/02/2020 8:41 PM    [facility] at [facility] [address] [place], [place], [place] [address] Phone: [phone]; Fax: [phone]

## 2020-05-03 ENCOUNTER — Encounter: Payer: Self-pay | Admitting: Family Medicine

## 2020-05-03 ENCOUNTER — Ambulatory Visit: Payer: Medicare Other | Admitting: Family Medicine

## 2020-05-13 ENCOUNTER — Telehealth: Payer: Self-pay | Admitting: General Practice

## 2020-05-13 NOTE — Telephone Encounter (Signed)
Patient called [facility] Francisco Dodson to request medication refills for the following:  atorvastatin (LIPITOR) 80 MG tablet metoprolol tartrate (LOPRESSOR) 25 MG tablet nitroGLYCERIN (NITROSTAT) 0.4 MG SL tablet potassium chloride SA (KLOR-CON) 20 MEQ tablet ticagrelor (BRILINTA) 90 MG TABS tablet  Patient states he is almost out. Patient was advised to call Dr. Elise Benne office, Cardiology where he had missed an appointment:  [address] [place] [address] [phone]  This message was left on his voicemail

## 2020-06-07 ENCOUNTER — Ambulatory Visit: Payer: Medicare Other | Admitting: Family Medicine

## 2020-06-07 NOTE — Progress Notes (Deleted)
Cardiology Office Note  Date: 06/07/2020   ID: Francisco Dodson, DOB [birth year], MRN [id number]  PCP:  Patient, No Pcp Per  Cardiologist:  No primary care provider on file. Electrophysiologist:  None   Chief Complaint: Follow-up STEMI  History of Present Illness: Francisco Dodson is a 54 y.o. male with a history of STEMI, CAD, hyperlipidemia, hypokalemia, tobacco use.   He presented with inferior STEMI on 04/23/2020.  Initially presented to [facility] with persistent chest pain onset 2 days prior.  EKG showed inferior ST elevations.  Troponin peaked at greater than 27,000.  Cardiac catheterization: 100% stenosis of distal RCA, 85% stenosis/80% stenosis of OM 2, nonobstructive disease in LAD.  Successful PCI with DES to RCA echo LVEF 45 to 50%/inferior and posterior wall hypokinesis.  DAPT therapy initiated with beta-blocker and high intensity statin.  Discharged on aspirin and Brilinta.  Report stated he likely would have to convert to Plavix after 30 days due to financial issues..  He had transient bradycardia and hypotension during cath requiring IV fluids and atropine.  He tolerated low-dose beta-blocker and was discharged on beta-blocker.  Lipid panel showed total cholesterol 166, triglycerides 56, HDL 38, LDL 117.  Goal of LDL less than 70 given CAD.  Started on Lipitor 80 mg.  Will need to repeat lipid panel and LFTs in 6 to 8 weeks.  He was hypokalemic at 2.6 magnesium was normal.  Potassium was repleted but still slightly low at 3.3 at discharge.  Was discharged on 40 mEq of potassium daily.  Needing a BMP at follow-up.  Tobacco cessation was discussed.  Patient intends to quit  Past Medical History:  Diagnosis Date  . ATTENTION DEFICIT DISORDER, HX OF   . CHEST PAIN, ATYPICAL, HX OF   . Coronary artery disease involving native coronary artery of native heart with unstable angina pectoris (HCC) 04/23/2020   Multivessel CAD with luminal irregularity with narrowing up to 50% in the mid LAD;  normal ramus immediate vessel; circumflex marginal stent stenosis in a small caliber vessel of 85% ostial and 80% in the midsegment; and 20% proximal RCA narrowing with total occlusion proximal to the PDA takeoff. - DES PCI of RCA, ~ med Rx of LCx-OM  . DIZZINESS   . ELECTROCARDIOGRAM, ABNORMAL   . Hyperlipidemia with target LDL less than 70 04/24/2020  . ST elevation myocardial infarction involving right coronary artery (HCC) 04/23/2020   PCI to the RCA with insertion of a 2.5 x 18 mm Resolute Onyx stent postdilated to 2.54 mm with the 100% occlusion reduced to 0% and restoration of TIMI-3 flow.    Past Surgical History:  Procedure Laterality Date  . CORONARY/GRAFT ACUTE MI REVASCULARIZATION N/A 04/23/2020   Procedure: Coronary/Graft Acute MI Revascularization;  Surgeon: Lennette Bihari, MD;  Location: [facility] INVASIVE CV LAB;  Service: Cardiovascular; Culprit lesion: 100% distal RCA (PCI-Resolute Onyx DES 2.5 x 18--2.6 mm)  . LEFT HEART CATH AND CORONARY ANGIOGRAPHY N/A 04/23/2020   Procedure: LEFT HEART CATH AND CORONARY ANGIOGRAPHY;  Surgeon: Lennette Bihari, MD;  Location: [facility] INVASIVE CV LAB;  Service: Cardiovascular;  INF STEMI: CULPRIT 100% dRCA (DES PCI), 20% proximal RCA noted.  Proximal LAD 20% with mid LAD 50%.  Relatively small caliber OM branch 85 to 80% lesions -> per Dr. Tresa Endo, plan medical management    Current Outpatient Medications  Medication Sig Dispense Refill  . aspirin 81 MG chewable tablet Chew 1 tablet (81 mg total) by mouth daily.    Marland Kitchen  atorvastatin (LIPITOR) 80 MG tablet Take 1 tablet (80 mg total) by mouth daily. 30 tablet 2  . metoprolol tartrate (LOPRESSOR) 25 MG tablet Take 0.5 tablets (12.5 mg total) by mouth 2 (two) times daily. 30 tablet 2  . nitroGLYCERIN (NITROSTAT) 0.4 MG SL tablet Place 1 tablet (0.4 mg total) under the tongue every 5 (five) minutes as needed for chest pain. 25 tablet 2  . potassium chloride SA (KLOR-CON) 20 MEQ tablet Take 2 tablets (40 mEq total)  by mouth daily. 60 tablet 2  . ticagrelor (BRILINTA) 90 MG TABS tablet Take 1 tablet (90 mg total) by mouth 2 (two) times daily. 60 tablet 5   No current facility-administered medications for this visit.   Allergies:  Penicillins   Social History: The patient  reports that he has been smoking cigarettes. He has a 10.00 pack-year smoking history. He does not have any smokeless tobacco history on file. He reports that he does not drink alcohol and does not use drugs.   Family History: The patient's family history includes Heart attack in his brother, father, and mother; Heart disease in his brother, father, and mother; Stroke in his brother.   ROS:  Please see the history of present illness. Otherwise, complete review of systems is positive for {NONE DEFAULTED:18576::"none"}.  All other systems are reviewed and negative.   Physical Exam: VS:  There were no vitals taken for this visit., BMI There is no height or weight on file to calculate BMI.  Wt Readings from Last 3 Encounters:  04/23/20 190 lb 11.2 oz (86.5 kg)  01/25/16 180 lb (81.6 kg)  10/06/15 180 lb (81.6 kg)    General: Patient appears comfortable at rest. HEENT: Conjunctiva and lids normal, oropharynx clear with moist mucosa. Neck: Supple, no elevated JVP or carotid bruits, no thyromegaly. Lungs: Clear to auscultation, nonlabored breathing at rest. Cardiac: Regular rate and rhythm, no S3 or significant systolic murmur, no pericardial rub. Abdomen: Soft, nontender, no hepatomegaly, bowel sounds present, no guarding or rebound. Extremities: No pitting edema, distal pulses 2+. Skin: Warm and dry. Musculoskeletal: No kyphosis. Neuropsychiatric: Alert and oriented x3, affect grossly appropriate.  ECG:  {EKG/Telemetry Strips Reviewed:[phone]}  Recent Labwork: 04/23/2020: ALT 18; AST 29; Hemoglobin 13.8; Magnesium 2.0; Platelets 212 04/24/2020: BUN 18; Creatinine, Ser 1.01; Potassium 3.3; Sodium 138     Component Value  Date/Time   CHOL 166 04/23/2020 0114   TRIG 56 04/23/2020 0114   TRIG 72 10/10/2004 0000   HDL 38 (L) 04/23/2020 0114   CHOLHDL 4.4 04/23/2020 0114   VLDL 11 04/23/2020 0114   LDLCALC 117 (H) 04/23/2020 0114   LDLDIRECT 153 (H) 02/05/2012 1545    Other Studies Reviewed Today:   Left Heart Catheterization 04/23/2020:  Prox RCA lesion is 20% stenosed.  Post intervention, there is a 0% residual stenosis.  Dist RCA lesion is 100% stenosed.  2nd Mrg-1 lesion is 85% stenosed.  2nd Mrg-2 lesion is 80% stenosed.  Mid LAD to Dist LAD lesion is 50% stenosed.  Dist LAD lesion is 50% stenosed.  Prox LAD lesion is 20% stenosed.  A stent was successfully placed.  Acute inferior wall myocardial infarction secondary to total occlusion of the distal RCA just beyond the acute margin with initial TIMI 0 flow.  Multivessel CAD with luminal irregularity with narrowing up to 50% in the mid LAD; normal ramus immediate vessel; circumflex marginal stent stenosis in a small caliber vessel of 85% ostial and 80% in the midsegment; and 20% proximal  RCA narrowing with total occlusion proximal to the PDA takeoff.  Successful PCI to the RCA with insertion of a 2.5 x 18 mm Resolute Onyx stent postdilated to 2.54 mm with the 100% occlusion reduced to 0% and restoration of TIMI-3 flow.  Reperfusion associated transient hypotension requiring fluid bolus in addition to bradycardia treated with atropine 0.5 mg.  Recommendation: DAPT for minimum of 12 months. Will initiate atorvastatin 80 mg. Initial medical therapy for concomitant CAD. Smoking cessation is essential. Plan 2D echo in a.m. Diagnostic Dominance: Right  Intervention     _____________   Echocardiogram 04/23/2020: Impressions: 1. Left ventricular ejection fraction, by estimation, is 45 to 50%. The  left ventricle has mildly decreased function. The left ventricle  demonstrates regional wall motion abnormalities (see scoring    diagram/findings for description). Left ventricular  diastolic parameters were normal.  2. Right ventricular systolic function is normal. The right ventricular  size is normal. Tricuspid regurgitation signal is inadequate for assessing  PA pressure.  3. The mitral valve is grossly normal. No evidence of mitral valve  regurgitation. No evidence of mitral stenosis.  4. The aortic valve is tricuspid. Aortic valve regurgitation is not  visualized. No aortic stenosis is present.  5. The inferior vena cava is normal in size with greater than 50%  respiratory variability, suggesting right atrial pressure of 3 mmHg.   Conclusion(s)/Recommendation(s): Findings consistent with ischemic  cardiomyopathy.    Assessment and Plan:  1. ST elevation myocardial infarction involving right coronary artery (HCC)   2. Hyperlipidemia with target LDL less than 70   3. Hypokalemia   4. Tobacco use      Medication Adjustments/Labs and Tests Ordered: Current medicines are reviewed at length with the patient today.  Concerns regarding medicines are outlined above.   Disposition: Follow-up with ***  Signed, Rennis Harding, NP 06/07/2020 8:01 AM    [facility] [address] [place], [place], [place] [address] Phone: [phone]; Fax: [phone]

## 2021-05-30 NOTE — Congregational Nurse Program (Signed)
Dept: [phone]   Congregational Nurse Program Note  Date of Encounter: 05/30/2021  Past Medical History: Past Medical History:  Diagnosis Date   ATTENTION DEFICIT DISORDER, HX OF    CHEST PAIN, ATYPICAL, HX OF    Coronary artery disease involving native coronary artery of native heart with unstable angina pectoris (HCC) 04/23/2020   Multivessel CAD with luminal irregularity with narrowing up to 50% in the mid LAD; normal ramus immediate vessel; circumflex marginal stent stenosis in a small caliber vessel of 85% ostial and 80% in the midsegment; and 20% proximal RCA narrowing with total occlusion proximal to the PDA takeoff. - DES PCI of RCA, ~ med Rx of LCx-OM   DIZZINESS    ELECTROCARDIOGRAM, ABNORMAL    Hyperlipidemia with target LDL less than 70 04/24/2020   ST elevation myocardial infarction involving right coronary artery (HCC) 04/23/2020   PCI to the RCA with insertion of a 2.5 x 18 mm Resolute Onyx stent postdilated to 2.54 mm with the 100% occlusion reduced to 0% and restoration of TIMI-3 flow.    Encounter Details:  CNP Questionnaire - 05/30/21 1212       Questionnaire   Do you give verbal consent to treat you today? 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[employer], [employer]    Patient Status Not Applicable    Medical Provider No    Insurance Medicaid;Medicare    Intervention Counsel               Dept: [phone]   Congregational Nurse Program Note  Date of Encounter: 05/30/2021  Past Medical History: Past Medical History:  Diagnosis Date   ATTENTION DEFICIT DISORDER, HX OF    CHEST PAIN, ATYPICAL, HX OF    Coronary artery disease involving native coronary artery of native heart with unstable angina pectoris (HCC) 04/23/2020   Multivessel CAD with luminal irregularity with narrowing up to 50% in the mid LAD; normal ramus immediate vessel; circumflex marginal stent stenosis in a small caliber vessel of 85% ostial  and 80% in the midsegment; and 20% proximal RCA narrowing with total occlusion proximal to the PDA takeoff. - DES PCI of RCA, ~ med Rx of LCx-OM   DIZZINESS    ELECTROCARDIOGRAM, ABNORMAL    Hyperlipidemia with target LDL less than 70 04/24/2020   ST elevation myocardial infarction involving right coronary artery (HCC) 04/23/2020   PCI to the RCA with insertion of a 2.5 x 18 mm Resolute Onyx stent postdilated to 2.54 mm with the 100% occlusion reduced to 0% and restoration of TIMI-3 flow.    Encounter Details:  CNP Questionnaire - 05/30/21 1212       Questionnaire   Do you give verbal consent to treat you today? 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 Voice an understanding and said he will think about it. BP 126 /77; P 73. Jenene Slicker RN,  [place], [phone]

## 2021-06-16 NOTE — Congregational Nurse Program (Signed)
  Dept: [phone]   Congregational Nurse Program Note  Date of Encounter: 05/23/2021  Past Medical History: Past Medical History:  Diagnosis Date   ATTENTION DEFICIT DISORDER, HX OF    CHEST PAIN, ATYPICAL, HX OF    Coronary artery disease involving native coronary artery of native heart with unstable angina pectoris (HCC) 04/23/2020   Multivessel CAD with luminal irregularity with narrowing up to 50% in the mid LAD; normal ramus immediate vessel; circumflex marginal stent stenosis in a small caliber vessel of 85% ostial and 80% in the midsegment; and 20% proximal RCA narrowing with total occlusion proximal to the PDA takeoff. - DES PCI of RCA, ~ med Rx of LCx-OM   DIZZINESS    ELECTROCARDIOGRAM, ABNORMAL    Hyperlipidemia with target LDL less than 70 04/24/2020   ST elevation myocardial infarction involving right coronary artery (HCC) 04/23/2020   PCI to the RCA with insertion of a 2.5 x 18 mm Resolute Onyx stent postdilated to 2.54 mm with the 100% occlusion reduced to 0% and restoration of TIMI-3 flow.    Encounter Details:  CNP Questionnaire - 05/30/21 1220       Questionnaire   Do you give verbal consent to treat you today? 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 Bp 126/7 P 73.Reminded to continue taking his meds as prescribed, to follow a heart healthy diet and get his exercise. Jenene Slicker RN, [place], [phone]

## 2022-03-23 ENCOUNTER — Inpatient Hospital Stay (HOSPITAL_COMMUNITY)
Admission: EM | Admit: 2022-03-23 | Discharge: 2022-03-24 | DRG: 247 | Disposition: A | Discharge status: Home / Self Care | Payer: Medicare Other | Source: Ambulatory Visit | Attending: Cardiovascular Disease | Admitting: Cardiovascular Disease

## 2022-03-23 ENCOUNTER — Encounter (HOSPITAL_COMMUNITY)
Admission: EM | Disposition: A | Discharge status: Home / Self Care | Payer: Self-pay | Source: Ambulatory Visit | Attending: Cardiovascular Disease

## 2022-03-23 ENCOUNTER — Encounter (HOSPITAL_COMMUNITY): Payer: Self-pay | Admitting: Cardiovascular Disease

## 2022-03-23 ENCOUNTER — Other Ambulatory Visit (HOSPITAL_COMMUNITY): Payer: Self-pay

## 2022-03-23 ENCOUNTER — Inpatient Hospital Stay (HOSPITAL_COMMUNITY): Payer: Medicare Other

## 2022-03-23 DIAGNOSIS — I2119 ST elevation (STEMI) myocardial infarction involving other coronary artery of inferior wall: Secondary | ICD-10-CM | POA: Diagnosis present

## 2022-03-23 DIAGNOSIS — F17210 Nicotine dependence, cigarettes, uncomplicated: Secondary | ICD-10-CM | POA: Diagnosis present

## 2022-03-23 DIAGNOSIS — Z7982 Long term (current) use of aspirin: Secondary | ICD-10-CM

## 2022-03-23 DIAGNOSIS — I213 ST elevation (STEMI) myocardial infarction of unspecified site: Secondary | ICD-10-CM | POA: Diagnosis not present

## 2022-03-23 DIAGNOSIS — I2111 ST elevation (STEMI) myocardial infarction involving right coronary artery: Secondary | ICD-10-CM | POA: Diagnosis not present

## 2022-03-23 DIAGNOSIS — Z8249 Family history of ischemic heart disease and other diseases of the circulatory system: Secondary | ICD-10-CM

## 2022-03-23 DIAGNOSIS — Z91199 Patient's noncompliance with other medical treatment and regimen due to unspecified reason: Secondary | ICD-10-CM | POA: Diagnosis not present

## 2022-03-23 DIAGNOSIS — I472 Ventricular tachycardia, unspecified: Secondary | ICD-10-CM | POA: Diagnosis present

## 2022-03-23 DIAGNOSIS — E876 Hypokalemia: Secondary | ICD-10-CM | POA: Diagnosis present

## 2022-03-23 DIAGNOSIS — I2510 Atherosclerotic heart disease of native coronary artery without angina pectoris: Secondary | ICD-10-CM | POA: Diagnosis present

## 2022-03-23 DIAGNOSIS — Z7902 Long term (current) use of antithrombotics/antiplatelets: Secondary | ICD-10-CM

## 2022-03-23 DIAGNOSIS — E785 Hyperlipidemia, unspecified: Secondary | ICD-10-CM | POA: Diagnosis present

## 2022-03-23 DIAGNOSIS — R079 Chest pain, unspecified: Secondary | ICD-10-CM | POA: Diagnosis present

## 2022-03-23 DIAGNOSIS — Z955 Presence of coronary angioplasty implant and graft: Secondary | ICD-10-CM | POA: Diagnosis not present

## 2022-03-23 DIAGNOSIS — Z880 Allergy status to penicillin: Secondary | ICD-10-CM | POA: Diagnosis not present

## 2022-03-23 DIAGNOSIS — Z823 Family history of stroke: Secondary | ICD-10-CM

## 2022-03-23 DIAGNOSIS — I252 Old myocardial infarction: Secondary | ICD-10-CM

## 2022-03-23 DIAGNOSIS — F988 Other specified behavioral and emotional disorders with onset usually occurring in childhood and adolescence: Secondary | ICD-10-CM | POA: Diagnosis present

## 2022-03-23 DIAGNOSIS — Z720 Tobacco use: Secondary | ICD-10-CM | POA: Diagnosis present

## 2022-03-23 HISTORY — PX: CORONARY STENT INTERVENTION: CATH118234

## 2022-03-23 HISTORY — PX: LEFT HEART CATH AND CORONARY ANGIOGRAPHY: CATH118249

## 2022-03-23 LAB — BASIC METABOLIC PANEL
Anion gap: 8 (ref 5–15)
BUN: 12 mg/dL (ref 6–20)
CO2: 24 mmol/L (ref 22–32)
Calcium: 8.6 mg/dL — ABNORMAL LOW (ref 8.9–10.3)
Chloride: 105 mmol/L (ref 98–111)
Creatinine, Ser: 1.17 mg/dL (ref 0.61–1.24)
GFR, Estimated: >60 mL/min (ref >60)
Glucose, Bld: 113 mg/dL — ABNORMAL HIGH (ref 70–99)
Potassium: 3 mmol/L — ABNORMAL LOW (ref 3.5–5.1)
Sodium: 137 mmol/L (ref 135–145)

## 2022-03-23 LAB — POCT ACTIVATED CLOTTING TIME
Activated Clotting Time: 359 seconds
Activated Clotting Time: 480 seconds

## 2022-03-23 LAB — ECHOCARDIOGRAM COMPLETE
AR max vel: 2.89 cm2
AV Area VTI: 2.66 cm2
AV Area mean vel: 2.63 cm2
AV Mean grad: 2 mmHg
AV Peak grad: 4.5 mmHg
Ao pk vel: 1.06 m/s
Area-P 1/2: 3.12 cm2
MV VTI: 1.71 cm2
S' Lateral: 4.3 cm
Weight: 2920.65 oz

## 2022-03-23 LAB — POCT I-STAT, CHEM 8
BUN: 14 mg/dL (ref 6–20)
Calcium, Ion: 1.23 mmol/L (ref 1.15–1.40)
Chloride: 100 mmol/L (ref 98–111)
Creatinine, Ser: 1.2 mg/dL (ref 0.61–1.24)
Glucose, Bld: 139 mg/dL — ABNORMAL HIGH (ref 70–99)
HCT: 43 % (ref 39.0–52.0)
Hemoglobin: 14.6 g/dL (ref 13.0–17.0)
Potassium: 2.7 mmol/L — CL (ref 3.5–5.1)
Sodium: 140 mmol/L (ref 135–145)
TCO2: 25 mmol/L (ref 22–32)

## 2022-03-23 LAB — GLUCOSE, CAPILLARY: Glucose-Capillary: 118 mg/dL — ABNORMAL HIGH (ref 70–99)

## 2022-03-23 LAB — CBC
HCT: 41.5 % (ref 39.0–52.0)
Hemoglobin: 14.3 g/dL (ref 13.0–17.0)
MCH: 31.6 pg (ref 26.0–34.0)
MCHC: 34.5 g/dL (ref 30.0–36.0)
MCV: 91.8 fL (ref 80.0–100.0)
Platelets: 253 10*3/uL (ref 150–400)
RBC: 4.52 MIL/uL (ref 4.22–5.81)
RDW: 13.5 % (ref 11.5–15.5)
WBC: 10.3 10*3/uL (ref 4.0–10.5)
nRBC: 0 % (ref 0.0–0.2)

## 2022-03-23 LAB — MRSA NEXT GEN BY PCR, NASAL: MRSA by PCR Next Gen: NOT DETECTED

## 2022-03-23 SURGERY — LEFT HEART CATH AND CORONARY ANGIOGRAPHY
Anesthesia: LOCAL

## 2022-03-23 MED ORDER — NITROGLYCERIN 0.4 MG SL SUBL: 0.4000 mg | SUBLINGUAL_TABLET | SUBLINGUAL | Status: DC | PRN

## 2022-03-23 MED ORDER — HEPARIN (PORCINE) 25000 UT/250ML-% IV SOLN
INTRAVENOUS | Status: AC
  Filled 2022-03-23: qty 250

## 2022-03-23 MED ORDER — METOPROLOL TARTRATE 25 MG PO TABS
25.0000 mg | ORAL_TABLET | Freq: Two times a day (BID) | ORAL | Status: DC
  Administered 2022-03-23 – 2022-03-24 (×3): 25 mg via ORAL
  Filled 2022-03-23 (×3): qty 1

## 2022-03-23 MED ORDER — HEPARIN (PORCINE) IN NACL 1000-0.9 UT/500ML-% IV SOLN
INTRAVENOUS | Status: DC | PRN
  Administered 2022-03-23 (×2): 500 mL

## 2022-03-23 MED ORDER — ATORVASTATIN CALCIUM 80 MG PO TABS: 80.0000 mg | ORAL_TABLET | Freq: Every day | ORAL | Status: DC

## 2022-03-23 MED ORDER — LIDOCAINE HCL (PF) 1 % IJ SOLN
INTRAMUSCULAR | Status: DC | PRN
  Administered 2022-03-23: 2 mL via INTRADERMAL

## 2022-03-23 MED ORDER — VERAPAMIL HCL 2.5 MG/ML IV SOLN
INTRA_ARTERIAL | Status: DC | PRN
  Administered 2022-03-23: 5 mL via INTRA_ARTERIAL

## 2022-03-23 MED ORDER — MORPHINE SULFATE (PF) 2 MG/ML IV SOLN: 2.0000 mg | INTRAVENOUS | Status: DC | PRN

## 2022-03-23 MED ORDER — TICAGRELOR 90 MG PO TABS
90.0000 mg | ORAL_TABLET | Freq: Two times a day (BID) | ORAL | Status: DC
  Administered 2022-03-23 – 2022-03-24 (×3): 90 mg via ORAL
  Filled 2022-03-23 (×3): qty 1

## 2022-03-23 MED ORDER — ASPIRIN 81 MG PO CHEW: 81.0000 mg | CHEWABLE_TABLET | Freq: Every day | ORAL | Status: DC

## 2022-03-23 MED ORDER — ACETAMINOPHEN 325 MG PO TABS: 650.0000 mg | ORAL_TABLET | ORAL | Status: DC | PRN

## 2022-03-23 MED ORDER — IOHEXOL 350 MG/ML SOLN
INTRAVENOUS | Status: DC | PRN
  Administered 2022-03-23: 180 mL

## 2022-03-23 MED ORDER — TICAGRELOR 90 MG PO TABS
ORAL_TABLET | ORAL | Status: DC | PRN
  Administered 2022-03-23: 180 mg via ORAL

## 2022-03-23 MED ORDER — SODIUM CHLORIDE 0.9 % IV SOLN
INTRAVENOUS | Status: AC | PRN
  Administered 2022-03-23: 250 mL via INTRAVENOUS

## 2022-03-23 MED ORDER — SODIUM CHLORIDE 0.9% FLUSH
3.0000 mL | Freq: Two times a day (BID) | INTRAVENOUS | Status: DC
  Administered 2022-03-23 (×2): 3 mL via INTRAVENOUS

## 2022-03-23 MED ORDER — HEPARIN SODIUM (PORCINE) 1000 UNIT/ML IJ SOLN
INTRAMUSCULAR | Status: DC | PRN
  Administered 2022-03-23: 5000 [IU] via INTRAVENOUS
  Administered 2022-03-23: 4000 [IU] via INTRAVENOUS

## 2022-03-23 MED ORDER — LABETALOL HCL 5 MG/ML IV SOLN: 10.0000 mg | INTRAVENOUS | Status: AC | PRN

## 2022-03-23 MED ORDER — HYDRALAZINE HCL 20 MG/ML IJ SOLN: 10.0000 mg | INTRAMUSCULAR | Status: AC | PRN

## 2022-03-23 MED ORDER — ASPIRIN 81 MG PO CHEW
81.0000 mg | CHEWABLE_TABLET | Freq: Every day | ORAL | Status: DC
Start: 2022-03-23 — End: 2022-03-24
  Administered 2022-03-23 – 2022-03-24 (×2): 81 mg via ORAL
  Filled 2022-03-23 (×2): qty 1

## 2022-03-23 MED ORDER — SODIUM CHLORIDE 0.9 % IV SOLN: 250.0000 mL | INTRAVENOUS | Status: DC | PRN

## 2022-03-23 MED ORDER — SODIUM CHLORIDE 0.9% FLUSH: 3.0000 mL | INTRAVENOUS | Status: DC | PRN

## 2022-03-23 MED ORDER — CHLORHEXIDINE GLUCONATE CLOTH 2 % EX PADS
6.0000 | MEDICATED_PAD | Freq: Every day | CUTANEOUS | Status: DC
  Administered 2022-03-23 – 2022-03-24 (×2): 6 via TOPICAL

## 2022-03-23 MED ORDER — NITROGLYCERIN IN D5W 200-5 MCG/ML-% IV SOLN
INTRAVENOUS | Status: AC
  Filled 2022-03-23: qty 250

## 2022-03-23 MED ORDER — POTASSIUM CHLORIDE CRYS ER 20 MEQ PO TBCR
60.0000 meq | EXTENDED_RELEASE_TABLET | Freq: Once | ORAL | Status: AC
  Administered 2022-03-23: 60 meq via ORAL
  Filled 2022-03-23: qty 3

## 2022-03-23 MED ORDER — ONDANSETRON HCL 4 MG/2ML IJ SOLN: 4.0000 mg | Freq: Four times a day (QID) | INTRAMUSCULAR | Status: DC | PRN

## 2022-03-23 MED ORDER — ORAL CARE MOUTH RINSE: 15.0000 mL | OROMUCOSAL | Status: DC | PRN

## 2022-03-23 MED ORDER — POTASSIUM CHLORIDE 10 MEQ/100ML IV SOLN
INTRAVENOUS | Status: AC
  Administered 2022-03-23: 10 meq
  Filled 2022-03-23: qty 100

## 2022-03-23 MED ORDER — METOPROLOL TARTRATE 12.5 MG HALF TABLET: 12.5000 mg | ORAL_TABLET | Freq: Two times a day (BID) | ORAL | Status: DC

## 2022-03-23 MED ORDER — ATORVASTATIN CALCIUM 80 MG PO TABS
80.0000 mg | ORAL_TABLET | Freq: Every day | ORAL | Status: DC
  Administered 2022-03-23 – 2022-03-24 (×2): 80 mg via ORAL
  Filled 2022-03-23 (×2): qty 1

## 2022-03-23 MED ORDER — SODIUM CHLORIDE 0.9 % IV SOLN: INTRAVENOUS | Status: AC

## 2022-03-23 MED ORDER — POTASSIUM CHLORIDE 10 MEQ/100ML IV SOLN
INTRAVENOUS | Status: AC | PRN
  Administered 2022-03-23: 10 meq via INTRAVENOUS

## 2022-03-23 MED ORDER — TICAGRELOR 90 MG PO TABS: 90.0000 mg | ORAL_TABLET | Freq: Two times a day (BID) | ORAL | Status: DC

## 2022-03-23 SURGICAL SUPPLY — 19 items
BALLN SAPPHIRE 2.0X12 (BALLOONS) ×2
BALLOON SAPPHIRE 2.0X12 (BALLOONS) IMPLANT
BAND CMPR LRG ZPHR (HEMOSTASIS) ×1
BAND ZEPHYR COMPRESS 30 LONG (HEMOSTASIS) ×1 IMPLANT
CATH OPTITORQUE TIG 4.0 5F (CATHETERS) ×1 IMPLANT
CATH VISTA GUIDE 6FR JR4 (CATHETERS) ×1 IMPLANT
ELECT DEFIB PAD ADLT CADENCE (PAD) ×1 IMPLANT
GLIDESHEATH SLEND A-KIT 6F 22G (SHEATH) ×1 IMPLANT
GUIDEWIRE INQWIRE 1.5J.035X260 (WIRE) IMPLANT
INQWIRE 1.5J .035X260CM (WIRE) ×2
KIT ENCORE 26 ADVANTAGE (KITS) ×1 IMPLANT
KIT HEART LEFT (KITS) ×3 IMPLANT
PACK CARDIAC CATHETERIZATION (CUSTOM PROCEDURE TRAY) ×3 IMPLANT
STENT ONYX FRONTIER 2.0X15 (Permanent Stent) ×2 IMPLANT
SYR MEDRAD MARK 7 150ML (SYRINGE) ×3 IMPLANT
TRANSDUCER W/STOPCOCK (MISCELLANEOUS) ×3 IMPLANT
TUBING CIL FLEX 10 FLL-RA (TUBING) ×3 IMPLANT
WIRE ASAHI PROWATER 180CM (WIRE) ×1 IMPLANT
WIRE HI TORQ VERSACORE-J 145CM (WIRE) ×1 IMPLANT

## 2022-03-23 NOTE — Progress Notes (Signed)
  Transition of Care [facility]) Screening Note   Patient Details  Name: Francisco Dodson Date of Birth: 1966/04/05   Transition of Care [facility]) CM/SW Contact:    Delilah Shan, LCSWA Phone Number: 03/23/2022, 4:52 PM    Transition of Care Department [facility]) has reviewed patient and no TOC needs have been identified at this time. We will continue to monitor patient advancement through interdisciplinary progression rounds. If new patient transition needs arise, please place a TOC consult.

## 2022-03-23 NOTE — ED Triage Notes (Signed)
Pt bib [facility] from [facility] as code STEMI, non-compliant with cardiac medications for 1 year. A&O x4, chest pressure 3-4 out of 10., 4mg  morphine, 4mg  zofran, 325mg  aspirin, 5000 units subQ heparin given before arrival to [facility] ED, nitroglycerin infusing at 33mcg/hr.

## 2022-03-23 NOTE — Progress Notes (Signed)
Progress Note  Patient Name: Francisco Dodson Date of Encounter: 03/23/2022  [facility] Cardiologist: Claiborne Billings  Subjective   No chest pain. Feels much better.   Inpatient Medications    Scheduled Meds:  aspirin  81 mg Oral Daily   atorvastatin  80 mg Oral Daily   Chlorhexidine Gluconate Cloth  6 each Topical Q0600   metoprolol tartrate  12.5 mg Oral BID   sodium chloride flush  3 mL Intravenous Q12H   ticagrelor  90 mg Oral BID   Continuous Infusions:  sodium chloride 75 mL/hr at 03/23/22 0600   sodium chloride     heparin     nitroGLYCERIN     PRN Meds: sodium chloride, acetaminophen, heparin, hydrALAZINE, labetalol, morphine injection, nitroGLYCERIN, ondansetron (ZOFRAN) IV, sodium chloride flush   Vital Signs    Vitals:   03/23/22 0445 03/23/22 0500 03/23/22 0508 03/23/22 0600  BP: 123/74 129/78 120/71   Pulse: 68 72 69 69  Resp: 17 (!) _0 Temp:  98 F (36.7 C)    TempSrc:  Oral    SpO2: 92% 96% 93% 95%  Weight:  82.8 kg      Intake/Output Summary (Last 24 hours) at 03/23/2022 0724 Last data filed at 03/23/2022 0500 Gross per 24 hour  Intake 1100 ml  Output --  Net 1100 ml      03/23/2022    5:00 AM 03/23/2022    3:19 AM 04/23/2020    5:00 AM  Last 3 Weights  Weight (lbs) 182 lb 8.7 oz 184 lb 8.4 oz 190 lb 11.2 oz  Weight (kg) 82.8 kg 83.7 kg 86.5 kg      Telemetry    Sinus, short runs of VT - Personally Reviewed  ECG    Sinus - Personally Reviewed  Physical Exam   GEN: No acute distress.   Neck: No JVD Cardiac: RRR, no murmurs, rubs, or gallops.  Respiratory: Clear to auscultation bilaterally. GI: Soft, nontender, non-distended  MS: No edema; No deformity. Neuro:  Nonfocal  Psych: Normal affect   Labs    High Sensitivity Troponin:  No results for input(s): "TROPONINIHS" in the last 720 hours.   Chemistry Recent Labs  Lab 03/23/22 0329 03/23/22 0556  NA 140 137  K 2.7* 3.0*  CL 100 105  CO2  --  24  GLUCOSE 139* 113*  BUN  14 12  CREATININE 1.20 1.17  CALCIUM  --  8.6*  GFRNONAA  --  >60  ANIONGAP  --  8    Lipids No results for input(s): "CHOL", "TRIG", "HDL", "LABVLDL", "LDLCALC", "CHOLHDL" in the last 168 hours.  Hematology Recent Labs  Lab 03/23/22 0329 03/23/22 0556  WBC  --  10.3  RBC  --  4.52  HGB 14.6 14.3  HCT 43.0 41.5  MCV  --  91.8  MCH  --  31.6  MCHC  --  34.5  RDW  --  13.5  PLT  --  253   Thyroid No results for input(s): "TSH", "FREET4" in the last 168 hours.  BNPNo results for input(s): "BNP", "PROBNP" in the last 168 hours.  DDimer No results for input(s): "DDIMER" in the last 168 hours.   Radiology    CARDIAC CATHETERIZATION  Result Date: 03/23/2022 Images from the original result were not included.   Mid LAD lesion is 50% stenosed.   2nd Mrg lesion is 90% stenosed.   RPDA-2 lesion is 95% stenosed.   RPDA-1 lesion is 75% stenosed.  Previously placed Dist RCA stent of unknown type is  widely patent.   A drug-eluting stent was successfully placed using a STENT ONYX FRONTIER 2.0X15.   Post intervention, there is a 0% residual stenosis.   Post intervention, there is a 0% residual stenosis.   There is moderate left ventricular systolic dysfunction.   LV end diastolic pressure is moderately elevated.   The left ventricular ejection fraction is 45-50% by visual estimate. Giulio Bertino is a 56 y.o. male  [id number] LOCATION:  FACILITY: [facility] PHYSICIAN: Quay Burow, M.D. 10/03/1966 DATE OF PROCEDURE:  03/23/2022 DATE OF DISCHARGE: CARDIAC CATHETERIZATION / PCI DES PDA History obtained from chart review.  56 year old Caucasian male with prior history of CAD status post distal RCA intervention by Dr. Claiborne Billings 04/23/2020 in the setting of inferior STEMI.  He did have moderately severe disease of an obtuse marginal branch and moderate disease of his LAD at that time with an inferior wall motion abnormality.  He was discharged home on dual antiplatelet therapy.  He failed to take his medications, continues  to smoke and did not show up to outpatient follow-up.  He developed chest pain this evening and presented to [facility] where his EKG was consistent with inferior STEMI and he was transported for urgent cath. PROCEDURE DESCRIPTION: The patient was brought to the second floor [place] Cardiac cath lab in the postabsorptive state. He was not premedicated . His right wrist was prepped and shaved in usual sterile fashion. Xylocaine 1% was used for local anesthesia. A 6 French sheath was inserted into the right radial artery using standard Seldinger technique. The patient received 4000 units  of heparin intravenously.  A 5 [place] TIG catheter was used for selective coronary angiography and obtain left heart pressures.  Isovue dye is used for the entirety of the case (120 cc contrast total to patient).  Retrograde aortic, ventricular and pullback pressures were recorded.  Radial cocktail was administered via the SideArm sheath.  (LVEDP was 35). The patient received an additional 5000 units of heparin with an ACT of 359.  Isovue dye is used for the entirety of the intervention.  Retrograde ordered pressures monitored to the case.  Patient did receive 180 mg of p.o. Brilinta. Using a 6 [place] JR4 guide catheter along with a 0.14 [place] guidewire and a 2 mm x 12 mm balloon the PDA was dilated.  Overlapping 2 mm x 15 mm long Medtronic Onyx frontier drug-eluting stents were then placed from the ostium of the PDA into the midportion encompassing the diseased segments deployed at 12 to 14 atm resulting in reduction of a 95% mid PDA and long 70% ostial/proximal PDA to 0% residual.  The patient tolerated procedure well.  There were no hemodynamic or electrocardiographic sequelae.  He was pain-free at the end of the case.   Successful PTA PCI drug-eluting stenting in the setting of inferior STEMI.  There was TIMI-3 flow however at the beginning of the case.  The previously placed stent in the distal RCA was patent.  The  long disease in his obtuse marginal branch has progressed now in the 95% range.  His EF is in the 35 to 40% range with inferior hypokinesia and L elevated LVEDP.  He will need DAPT uninterrupted for 12 months.  Medications compliance will be an issue since he did not take his medications nor did he show up for follow-up visits after his last intervention.  We discussed the importance of smoking cessation.  The radial sheath  was removed and a TR band was placed on the right wrist to achieve patent hemostasis.  The patient left lab in stable condition. Quay Burow. MD, [facility] 03/23/2022 4:20 AM     Cardiac Studies   See above  Patient Profile     56 y.o. male with history of CAD with stenting of the RCA in 2021, tobacco abuse and medical non-compliance admitted 03/23/22 with an acute   Assessment & Plan    CAD/Inferior STEMI: Stable this am with no chest pain. Will plan to continue DAPT with ASA/Brilinta for one year. Continue high intensity statin. Continue beta blocker.  2.   Tobacco abuse: Cessation is recommended.  3.   Hypokalemia: Will replace this am.  4.   NSVT: Will increase metoprolol to 25 mg BID. Follow on telemetry 24 more hours.   Transfer to telemetry. Probable d/c home tomorrow    For questions or updates, please contact [place] Please consult [email or web address] for contact info under        Signed, Lauree Chandler, MD  03/23/2022, 7:24 AM

## 2022-03-23 NOTE — TOC Benefit Eligibility Note (Signed)
Patient Product/process development scientist completed.    The patient is currently admitted and upon discharge could be taking Brilinta 90 mg.  The current 30 day co-pay is, $4.30.   The patient is insured through [employer] Part D    Roland Earl, CPhT Pharmacy Patient Advocate Specialist [facility] Patient Advocate Team Direct Number: [phone]  Fax: [phone]

## 2022-03-23 NOTE — ED Notes (Addendum)
Heart catheterization team at pt bedside ready to receive pt, unable to obtain full set of vital signs prior to transferring pt to catheterization lab. Transferred on Zoll with translucent pads applied

## 2022-03-23 NOTE — H&P (Signed)
Cardiology Admission History and Physical:   Patient ID: Francisco Dodson MRN: [id number]; DOB: [birth year]   Admission date: 03/23/2022  PCP:  Patient, No Pcp Per   [facility] Providers Cardiologist:  None        Chief Complaint:  CHEST PAIN   Patient Profile:   Francisco Dodson is a 56 y.o. 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 for the evaluation of chest pain.  History of Present Illness:   Francisco Dodson states he was at the baseline of health until last night, he started experiencing excruciating left-sided chest tightness (8/10 on severity) while eating meal associated with mild shortness of breath. The pain does not radiate. The pain is worsened with exertion. He states this pain appears to be similar to the pain he had prior to his PCI in 2021. Denies fever, chills, syncope, heart palpitations, dyspnea, nausea, vomiting, abdominal fullness, dysuria, diarrhea, pedal edema or any bleeding events. Currently he is still having CP (6/10 on severity) on IV nitro, HDS.  Patient reports continue tobacco use daily. He does not take his medications daily. He does not follow with medical service routinely.  ECG demonstrated SR, inferior STE.  Past Medical History:  Diagnosis Date   ATTENTION DEFICIT DISORDER, HX OF    CHEST PAIN, ATYPICAL, HX OF    Coronary artery disease involving native coronary artery of native heart with unstable angina pectoris (HCC) 04/23/2020   Multivessel CAD with luminal irregularity with narrowing up to 50% in the mid LAD; normal ramus immediate vessel; circumflex marginal stent stenosis in a small caliber vessel of 85% ostial and 80% in the midsegment; and 20% proximal RCA narrowing with total occlusion proximal to the PDA takeoff. - DES PCI of RCA, ~ med Rx of LCx-OM   DIZZINESS    ELECTROCARDIOGRAM, ABNORMAL    Hyperlipidemia with target LDL less than 70 04/24/2020   ST  elevation myocardial infarction involving right coronary artery (HCC) 04/23/2020   PCI to the RCA with insertion of a 2.5 x 18 mm Resolute Onyx stent postdilated to 2.54 mm with the 100% occlusion reduced to 0% and restoration of TIMI-3 flow.    Past Surgical History:  Procedure Laterality Date   CORONARY/GRAFT ACUTE MI REVASCULARIZATION N/A 04/23/2020   Procedure: Coronary/Graft Acute MI Revascularization;  Surgeon: Lennette Bihari, MD;  Location: [facility] INVASIVE CV LAB;  Service: Cardiovascular; Culprit lesion: 100% distal RCA (PCI-Resolute Onyx DES 2.5 x 18--2.6 mm)   LEFT HEART CATH AND CORONARY ANGIOGRAPHY N/A 04/23/2020   Procedure: LEFT HEART CATH AND CORONARY ANGIOGRAPHY;  Surgeon: Lennette Bihari, MD;  Location: [facility] INVASIVE CV LAB;  Service: Cardiovascular;  INF STEMI: CULPRIT 100% dRCA (DES PCI), 20% proximal RCA noted.  Proximal LAD 20% with mid LAD 50%.  Relatively small caliber OM branch 85 to 80% lesions -> per Dr. Tresa Endo, plan medical management     Medications Prior to Admission: Prior to Admission medications   Medication Sig Start Date End Date Taking? Authorizing Provider  aspirin 81 MG chewable tablet Chew 1 tablet (81 mg total) by mouth daily. 04/25/20   Marjie Skiff E, PA-C  atorvastatin (LIPITOR) 80 MG tablet Take 1 tablet (80 mg total) by mouth daily. 04/25/20   Marjie Skiff E, PA-C  metoprolol tartrate (LOPRESSOR) 25 MG tablet Take 0.5 tablets (12.5 mg total) by mouth 2 (two) times daily. 04/24/20   Corrin Parker, PA-C  nitroGLYCERIN (NITROSTAT) 0.4 MG SL tablet Place 1 tablet (0.4 mg total) under the tongue every 5 (five) minutes as needed for chest pain. 04/24/20   Sande Rives E, PA-C  potassium chloride SA (KLOR-CON) 20 MEQ tablet Take 2 tablets (40 mEq total) by mouth daily. 04/24/20   Darreld Mclean, PA-C  ticagrelor (BRILINTA) 90 MG TABS tablet Take 1 tablet (90 mg total) by mouth 2 (two) times daily. 04/24/20   Darreld Mclean, PA-C     Allergies:     Allergies  Allergen Reactions   Penicillins Other (See Comments)    Almost killed patient     Social History:   Social History   Socioeconomic History   Marital status: Single    Spouse name: Not on file   Number of children: Not on file   Years of education: Not on file   Highest education level: Not on file  Occupational History   Not on file  Tobacco Use   Smoking status: Every Day    Packs/day: 1.00    Years: 10.00    Total pack years: 10.00    Types: Cigarettes   Smokeless tobacco: Not on file  Substance and Sexual Activity   Alcohol use: No   Drug use: No   Sexual activity: Never    Birth control/protection: None  Other Topics Concern   Not on file  Social History Narrative   Not on file   Social Determinants of Health   Financial Resource Strain: Not on file  Food Insecurity: Not on file  Transportation Needs: Not on file  Physical Activity: Not on file  Stress: Not on file  Social Connections: Not on file  Intimate Partner Violence: Not on file    Family History:   The patient's family history includes Heart attack in his brother, father, and mother; Heart disease in his brother, father, and mother; Stroke in his brother.    ROS:  Please see the history of present illness.  All other ROS reviewed and negative.     Physical Exam/Data:  There were no vitals filed for this visit. No intake or output data in the 24 hours ending 03/23/22 0334    04/23/2020    5:00 AM 04/23/2020    2:00 AM 01/25/2016    5:37 PM  Last 3 Weights  Weight (lbs) 190 lb 11.2 oz 185 lb 3 oz 180 lb  Weight (kg) 86.5 kg 84 kg 81.647 kg     There is no height or weight on file to calculate BMI.  General:  Well nourished, well developed, in no acute distress HEENT: normal Neck: no JVD Vascular: No carotid bruits; Distal pulses 2+ bilaterally   Cardiac:  normal S1, S2; RRR; no murmur  Lungs:  clear to auscultation bilaterally, no wheezing, rhonchi or rales  Abd: soft,  nontender, no hepatomegaly  Ext: no edema Musculoskeletal:  No deformities, BUE and BLE strength normal and equal Skin: warm and dry  Neuro:  CNs 2-12 intact, no focal abnormalities noted Psych:  Normal affect   Relevant CV Studies:   Laboratory Data:  High Sensitivity Troponin:  No results for input(s): "TROPONINIHS" in the last 720 hours.    ChemistryNo results for input(s): "NA", "K", "CL", "CO2", "GLUCOSE", "BUN", "CREATININE", "CALCIUM", "MG", "GFRNONAA", "GFRAA", "ANIONGAP" in the last 168 hours.  No results for input(s): "PROT", "ALBUMIN", "AST", "ALT", "ALKPHOS", "BILITOT" in the last 168 hours. Lipids No results for input(s): "CHOL", "TRIG", "HDL", "LABVLDL", "LDLCALC", "CHOLHDL" in the  last 168 hours. HematologyNo results for input(s): "WBC", "RBC", "HGB", "HCT", "MCV", "MCH", "MCHC", "RDW", "PLT" in the last 168 hours. Thyroid No results for input(s): "TSH", "FREET4" in the last 168 hours. BNPNo results for input(s): "BNP", "PROBNP" in the last 168 hours.  DDimer No results for input(s): "DDIMER" in the last 168 hours.   Radiology/Studies:  No results found.   Assessment and Plan:   #STEMI #inferior STEMI s/p DES to RCA in 2021inferior STEMI s/p DES to RCA in 2021 -EKG at OSH and our ED demonstrated ST elevation inferior leads  -continue ASA and high-intensity of statin -secondary aggressive risk stratification, check A1C, TSH and lipid panel -acquire a TTE am -Further management post coronary angiogram/angioplasty results  #Tobacco abuse #Medical noncompliance  Risk Assessment/Risk Scores:    TIMI Risk Score for ST  Elevation MI:   The patient's TIMI risk score is 0, which indicates a 0.8% risk of all cause mortality at 30 days.    Severity of Illness: The appropriate patient status for this patient is INPATIENT. Inpatient status is judged to be reasonable and necessary in order to provide the required intensity of service to ensure the patient's safety. The  patient's presenting symptoms, physical exam findings, and initial radiographic and laboratory data in the context of their chronic comorbidities is felt to place them at high risk for further clinical deterioration. Furthermore, it is not anticipated that the patient will be medically stable for discharge from the hospital within 2 midnights of admission.   * I certify that at the point of admission it is my clinical judgment that the patient will require inpatient hospital care spanning beyond 2 midnights from the point of admission due to high intensity of service, high risk for further deterioration and high frequency of surveillance required.*   For questions or updates, please contact [facility] Please consult [email or web address] for contact info under     Signed, Filiberto Pinks, MD  03/23/2022 3:34 AM

## 2022-03-23 NOTE — Plan of Care (Signed)

## 2022-03-23 NOTE — Progress Notes (Signed)
  Echocardiogram 2D Echocardiogram has been performed.  Gerda Diss 03/23/2022, 9:41 AM

## 2022-03-24 ENCOUNTER — Telehealth: Payer: Self-pay

## 2022-03-24 ENCOUNTER — Other Ambulatory Visit (HOSPITAL_COMMUNITY): Payer: Self-pay

## 2022-03-24 ENCOUNTER — Encounter (HOSPITAL_COMMUNITY): Payer: Self-pay | Admitting: Cardiovascular Disease

## 2022-03-24 ENCOUNTER — Other Ambulatory Visit: Payer: Self-pay

## 2022-03-24 DIAGNOSIS — I2119 ST elevation (STEMI) myocardial infarction involving other coronary artery of inferior wall: Secondary | ICD-10-CM | POA: Diagnosis not present

## 2022-03-24 LAB — CBC
HCT: 41.6 % (ref 39.0–52.0)
Hemoglobin: 13.9 g/dL (ref 13.0–17.0)
MCH: 31.3 pg (ref 26.0–34.0)
MCHC: 33.4 g/dL (ref 30.0–36.0)
MCV: 93.7 fL (ref 80.0–100.0)
Platelets: 246 10*3/uL (ref 150–400)
RBC: 4.44 MIL/uL (ref 4.22–5.81)
RDW: 13.8 % (ref 11.5–15.5)
WBC: 10.3 10*3/uL (ref 4.0–10.5)
nRBC: 0 % (ref 0.0–0.2)

## 2022-03-24 LAB — BASIC METABOLIC PANEL
Anion gap: 7 (ref 5–15)
BUN: 14 mg/dL (ref 6–20)
CO2: 23 mmol/L (ref 22–32)
Calcium: 8.5 mg/dL — ABNORMAL LOW (ref 8.9–10.3)
Chloride: 110 mmol/L (ref 98–111)
Creatinine, Ser: 1.11 mg/dL (ref 0.61–1.24)
GFR, Estimated: >60 mL/min (ref >60)
Glucose, Bld: 94 mg/dL (ref 70–99)
Potassium: 3.1 mmol/L — ABNORMAL LOW (ref 3.5–5.1)
Sodium: 140 mmol/L (ref 135–145)

## 2022-03-24 LAB — HEMOGLOBIN A1C
Hgb A1c MFr Bld: 5.1 % (ref 4.8–5.6)
Mean Plasma Glucose: 99.67 mg/dL

## 2022-03-24 LAB — MAGNESIUM: Magnesium: 2 mg/dL (ref 1.7–2.4)

## 2022-03-24 LAB — LIPID PANEL
Cholesterol: 136 mg/dL (ref 0–200)
HDL: 33 mg/dL — ABNORMAL LOW (ref >40)
LDL Cholesterol: 86 mg/dL (ref 0–99)
Total CHOL/HDL Ratio: 4.1 RATIO
Triglycerides: 85 mg/dL (ref <150)
VLDL: 17 mg/dL (ref 0–40)

## 2022-03-24 LAB — LIPOPROTEIN A (LPA): Lipoprotein (a): 58.8 nmol/L — ABNORMAL HIGH (ref <75.0)

## 2022-03-24 MED ORDER — METOPROLOL TARTRATE 25 MG PO TABS
25.0000 mg | ORAL_TABLET | Freq: Two times a day (BID) | ORAL | 1 refills | Status: DC
  Filled 2022-03-24: qty 180, 90d supply, fill #0

## 2022-03-24 MED ORDER — NITROGLYCERIN 0.4 MG SL SUBL
0.4000 mg | SUBLINGUAL_TABLET | SUBLINGUAL | 2 refills | Status: DC | PRN
  Filled 2022-03-24: qty 25, 14d supply, fill #0

## 2022-03-24 MED ORDER — POTASSIUM CHLORIDE CRYS ER 20 MEQ PO TBCR
60.0000 meq | EXTENDED_RELEASE_TABLET | Freq: Once | ORAL | Status: AC
  Administered 2022-03-24: 60 meq via ORAL
  Filled 2022-03-24: qty 3

## 2022-03-24 MED ORDER — TICAGRELOR 90 MG PO TABS
90.0000 mg | ORAL_TABLET | Freq: Two times a day (BID) | ORAL | 2 refills | Status: DC
  Filled 2022-03-24: qty 180, 90d supply, fill #0

## 2022-03-24 MED ORDER — ATORVASTATIN CALCIUM 80 MG PO TABS
80.0000 mg | ORAL_TABLET | Freq: Every day | ORAL | 1 refills | Status: DC
  Filled 2022-03-24: qty 90, 90d supply, fill #0

## 2022-03-24 MED ORDER — ASPIRIN 81 MG PO CHEW: 81.0000 mg | CHEWABLE_TABLET | Freq: Every day | ORAL | 2 refills | Status: DC

## 2022-03-24 NOTE — Discharge Summary (Addendum)
Discharge Summary    Patient ID: Francisco Dodson MRN: [id number]; DOB: 1965-11-16  Admit date: 03/23/2022 Discharge date: 03/24/2022  PCP:  Patient, No Pcp Per   [place] Providers Cardiologist:  Shelva Majestic, MD     Discharge Diagnoses    Principal Problem:   ST elevation myocardial infarction (STEMI) involving other coronary artery of inferior wall [facility]) Active Problems:   Hyperlipidemia with target LDL less than 70   Hypokalemia   Tobacco use   Diagnostic Studies/Procedures    Echo: 03/23/22  IMPRESSIONS     1. Left ventricular ejection fraction, by estimation, is 45%. The left  ventricle has normal function. The left ventricle demonstrates regional  wall motion abnormalities (see scoring diagram/findings for description).  Left ventricular diastolic  parameters are consistent with Grade I diastolic dysfunction (impaired  relaxation). There is akinesis of the left ventricular, entire inferior  wall, inferolateral wall and inferoseptal wall.   2. Right ventricular systolic function is normal. The right ventricular  size is normal.   3. The mitral valve is normal in structure. Trivial mitral valve  regurgitation. No evidence of mitral stenosis.   4. The aortic valve is normal in structure. Aortic valve regurgitation is  not visualized. No aortic stenosis is present.   5. The inferior vena cava is normal in size with greater than 50%  respiratory variability, suggesting right atrial pressure of 3 mmHg.   FINDINGS   Left Ventricle: Left ventricular ejection fraction, by estimation, is  45%. The left ventricle has normal function. The left ventricle  demonstrates regional wall motion abnormalities. The left ventricular  internal cavity size was normal in size. There is   no left ventricular hypertrophy. Left ventricular diastolic parameters  are consistent with Grade I diastolic dysfunction (impaired relaxation).   Right Ventricle: The right ventricular size is  normal. No increase in  right ventricular wall thickness. Right ventricular systolic function is  normal.   Left Atrium: Left atrial size was normal in size.   Right Atrium: Right atrial size was normal in size.   Pericardium: There is no evidence of pericardial effusion.   Mitral Valve: The mitral valve is normal in structure. Trivial mitral  valve regurgitation. No evidence of mitral valve stenosis. MV peak  gradient, 4.7 mmHg. The mean mitral valve gradient is 2.0 mmHg.   Tricuspid Valve: The tricuspid valve is normal in structure. Tricuspid  valve regurgitation is trivial. No evidence of tricuspid stenosis.   Aortic Valve: The aortic valve is normal in structure. Aortic valve  regurgitation is not visualized. No aortic stenosis is present. Aortic  valve mean gradient measures 2.0 mmHg. Aortic valve peak gradient measures  4.5 mmHg. Aortic valve area, by VTI  measures 2.66 cm.   Pulmonic Valve: The pulmonic valve was normal in structure. Pulmonic valve  regurgitation is not visualized. No evidence of pulmonic stenosis.   Aorta: The aortic root is normal in size and structure.   Venous: The inferior vena cava is normal in size with greater than 50%  respiratory variability, suggesting right atrial pressure of 3 mmHg.   IAS/Shunts: No atrial level shunt detected by color flow Doppler.   Cath: 03/23/22    Mid LAD lesion is 50% stenosed.   2nd Mrg lesion is 90% stenosed.   RPDA-2 lesion is 95% stenosed.   RPDA-1 lesion is 75% stenosed.   Previously placed Dist RCA stent of unknown type is  widely patent.   A drug-eluting stent was successfully placed using a  STENT ONYX FRONTIER 2.0X15.   Post intervention, there is a 0% residual stenosis.   Post intervention, there is a 0% residual stenosis.   There is moderate left ventricular systolic dysfunction.   LV end diastolic pressure is moderately elevated.   The left ventricular ejection fraction is 45-50% by visual  estimate.  IMPRESSION: Successful PTA PCI drug-eluting stenting in the setting of inferior STEMI.  There was TIMI-3 flow however at the beginning of the case.  The previously placed stent in the distal RCA was patent.  The long disease in his obtuse marginal branch has progressed now in the 95% range.  His EF is in the 35 to 40% range with inferior hypokinesia and L elevated LVEDP.  He will need DAPT uninterrupted for 12 months.  Medications compliance will be an issue since he did not take his medications nor did he show up for follow-up visits after his last intervention.  We discussed the importance of smoking cessation.  The radial sheath was removed and a TR band was placed on the right wrist to achieve patent hemostasis.  The patient left lab in stable condition.   Quay Burow. MD, [facility] 03/23/2022 4:20 AM  Diagnostic Dominance: Right  Intervention   _____________   History of Present Illness     Francisco Dodson is a 56 y.o. male with PMHx of inferior STEMI s/p DES to RCA in 2021, medical noncompliance and tobacco abuse who was transferred from [facility] for the evaluation of chest pain. [name] stated he was at the baseline of health until the night prior to admission, he started experiencing excruciating left-sided chest tightness (8/10 on severity) while eating meal associated with mild shortness of breath. The pain did not radiate. The pain was worsened with exertion. He stated the pain appeared to be similar to the pain he had prior to his PCI in 2021. Denied fever, chills, syncope, heart palpitations, dyspnea, nausea, vomiting, abdominal fullness, dysuria, diarrhea, pedal edema or any bleeding events.   Patient reported continue tobacco use daily. He did not take his medications daily. He does not follow with medical service routinely.   ECG demonstrated SR, inferior STE. He was taken emergently to the cardiac cath lab.   Hospital Course     CAD/Inferior STEMI: Underwent  cardiac cath noted above with PCI/DES x1 to the rPDA. Recommendations for DAPT with ASA/Brilinta for at least one year. No recurrent chest pain. Seen by CR. -- Continue ASA/Brilinta, high intensity statin and beta blocker.   Tobacco abuse: Cessation recommended.   Hypokalemia: hx of the same. Replaced prior to discharge.   NSVT: Improved.  -- continue metoprolol 25mg  BID  HFrEF: LVEF of 45% with akinesis of the LV, inferior/inferolateral/inferoseptal wall.  -- continue BB. Considered the addition of spiro but with compliance issues will defer  Noncompliance: hx of the same. Stressed compliance during admission   General: Well developed, well nourished, male appearing in no acute distress. Head: Normocephalic, atraumatic.  Neck: Supple without bruits, JVD. Lungs:  Resp regular and unlabored, CTA. Heart: RRR, S1, S2, no S3, S4, or murmur; no rub. Abdomen: Soft, non-tender, non-distended with normoactive bowel sounds. No hepatomegaly. No rebound/guarding. No obvious abdominal masses. Extremities: No clubbing, cyanosis, edema. Distal pedal pulses are 2+ bilaterally. Right cath site stable without bruising or hematoma Neuro: Alert and oriented X 3. Moves all extremities spontaneously. Psych: Normal affect.  Patient was seen by Dr. Angelena Form and deemed stable for discharge home. Follow up in the office has  been arranged. Medications sent to the [facility] pharmacy. Educated by pharmD prior to discharge.   Did the patient have an acute coronary syndrome (MI, NSTEMI, STEMI, etc) this admission?:  Yes                               AHA/ACC Clinical Performance & Quality Measures: Aspirin prescribed? - Yes ADP Receptor Inhibitor (Plavix/Clopidogrel, Brilinta/Ticagrelor or Effient/Prasugrel) prescribed (includes medically managed patients)? - Yes Beta Blocker prescribed? - Yes High Intensity Statin (Lipitor 40-80mg  or Crestor 20-40mg ) prescribed? - Yes EF assessed during THIS hospitalization? -  Yes For EF <40%, was ACEI/ARB prescribed? - Not Applicable (EF >/= 37%) For EF <40%, Aldosterone Antagonist (Spironolactone or Eplerenone) prescribed? - Not Applicable (EF >/= 04%) Cardiac Rehab Phase II ordered (including medically managed patients)? - Yes       The patient will be scheduled for a TOC follow up appointment in 10-14 days.  A message has been sent to the [facility] and Scheduling Pool at the office where the patient should be seen for follow up.  _____________  Discharge Vitals Blood pressure 128/74, pulse 68, temperature 98 F (36.7 C), temperature source Oral, resp. rate 18, height 5\' 7"  (1.702 m), weight 82.8 kg, SpO2 98 %.  Filed Weights   03/23/22 0319 03/23/22 0500 03/24/22 0108  Weight: 83.7 kg 82.8 kg 82.8 kg    Labs & Radiologic Studies    CBC Recent Labs    03/23/22 0556 03/24/22 0255  WBC 10.3 10.3  HGB 14.3 13.9  HCT 41.5 41.6  MCV 91.8 93.7  PLT 253 888   Basic Metabolic Panel Recent Labs    03/23/22 0556 03/24/22 0255  NA 137 140  K 3.0* 3.1*  CL 105 110  CO2 24 23  GLUCOSE 113* 94  BUN 12 14  CREATININE 1.17 1.11  CALCIUM 8.6* 8.5*  MG  --  2.0   Liver Function Tests No results for input(s): "AST", "ALT", "ALKPHOS", "BILITOT", "PROT", "ALBUMIN" in the last 72 hours. No results for input(s): "LIPASE", "AMYLASE" in the last 72 hours. High Sensitivity Troponin:   No results for input(s): "TROPONINIHS" in the last 720 hours.  BNP Invalid input(s): "POCBNP" D-Dimer No results for input(s): "DDIMER" in the last 72 hours. Hemoglobin A1C Recent Labs    03/24/22 0255  HGBA1C 5.1   Fasting Lipid Panel Recent Labs    03/24/22 0255  CHOL 136  HDL 33*  LDLCALC 86  TRIG 85  CHOLHDL 4.1   Thyroid Function Tests No results for input(s): "TSH", "T4TOTAL", "T3FREE", "THYROIDAB" in the last 72 hours.  Invalid input(s): "FREET3" _____________  ECHOCARDIOGRAM COMPLETE  Result Date: 03/23/2022    ECHOCARDIOGRAM REPORT   Patient  Name:   Francisco Dodson Date of Exam: 03/23/2022 Medical Rec #:  [id number]  Height:       67.0 in Accession #:    [id number] Weight:       182.5 lb Date of Birth:  January 24, 1966  BSA:          1.945 m Patient Age:    56 years   BP:           110/66 mmHg Patient Gender: M          HR:           66 bpm. Exam Location:  Inpatient Procedure: 2D Echo, Cardiac Doppler and Color Doppler Indications:    CAD - native vessel  History:        Patient has prior history of Echocardiogram examinations, most                 recent 04/23/2020. CAD, Previous Myocardial Infarction and Acute                 MI, Signs/Symptoms:Chest Pain; Risk Factors:Current Smoker.  Sonographer:    Clayton Lefort RDCS (AE) Referring Phys: [place]  1. Left ventricular ejection fraction, by estimation, is 45%. The left ventricle has normal function. The left ventricle demonstrates regional wall motion abnormalities (see scoring diagram/findings for description). Left ventricular diastolic parameters are consistent with Grade I diastolic dysfunction (impaired relaxation). There is akinesis of the left ventricular, entire inferior wall, inferolateral wall and inferoseptal wall.  2. Right ventricular systolic function is normal. The right ventricular size is normal.  3. The mitral valve is normal in structure. Trivial mitral valve regurgitation. No evidence of mitral stenosis.  4. The aortic valve is normal in structure. Aortic valve regurgitation is not visualized. No aortic stenosis is present.  5. The inferior vena cava is normal in size with greater than 50% respiratory variability, suggesting right atrial pressure of 3 mmHg. FINDINGS  Left Ventricle: Left ventricular ejection fraction, by estimation, is 45%. The left ventricle has normal function. The left ventricle demonstrates regional wall motion abnormalities. The left ventricular internal cavity size was normal in size. There is  no left ventricular hypertrophy. Left ventricular  diastolic parameters are consistent with Grade I diastolic dysfunction (impaired relaxation). Right Ventricle: The right ventricular size is normal. No increase in right ventricular wall thickness. Right ventricular systolic function is normal. Left Atrium: Left atrial size was normal in size. Right Atrium: Right atrial size was normal in size. Pericardium: There is no evidence of pericardial effusion. Mitral Valve: The mitral valve is normal in structure. Trivial mitral valve regurgitation. No evidence of mitral valve stenosis. MV peak gradient, 4.7 mmHg. The mean mitral valve gradient is 2.0 mmHg. Tricuspid Valve: The tricuspid valve is normal in structure. Tricuspid valve regurgitation is trivial. No evidence of tricuspid stenosis. Aortic Valve: The aortic valve is normal in structure. Aortic valve regurgitation is not visualized. No aortic stenosis is present. Aortic valve mean gradient measures 2.0 mmHg. Aortic valve peak gradient measures 4.5 mmHg. Aortic valve area, by VTI measures 2.66 cm. Pulmonic Valve: The pulmonic valve was normal in structure. Pulmonic valve regurgitation is not visualized. No evidence of pulmonic stenosis. Aorta: The aortic root is normal in size and structure. Venous: The inferior vena cava is normal in size with greater than 50% respiratory variability, suggesting right atrial pressure of 3 mmHg. IAS/Shunts: No atrial level shunt detected by color flow Doppler.  LEFT VENTRICLE PLAX 2D LVIDd:         5.60 cm   Diastology LVIDs:         4.30 cm   LV e' medial:    9.38 cm/s LV PW:         1.00 cm   LV E/e' medial:  10.6 LV IVS:        0.70 cm   LV e' lateral:   9.23 cm/s LVOT diam:     2.00 cm   LV E/e' lateral: 10.8 LV SV:         57 LV SV Index:   30 LVOT Area:     3.14 cm  RIGHT VENTRICLE  IVC RV Basal diam:  2.70 cm    IVC diam: 1.80 cm RV S prime:     9.23 cm/s TAPSE (M-mode): 2.1 cm LEFT ATRIUM             Index        RIGHT ATRIUM           Index LA diam:        3.80  cm 1.95 cm/m   RA Area:     14.10 cm LA Vol (A2C):   35.5 ml 18.25 ml/m  RA Volume:   35.20 ml  18.10 ml/m LA Vol (A4C):   41.2 ml 21.18 ml/m LA Biplane Vol: 40.5 ml 20.82 ml/m  AORTIC VALVE AV Area (Vmax):    2.89 cm AV Area (Vmean):   2.63 cm AV Area (VTI):     2.66 cm AV Vmax:           106.00 cm/s AV Vmean:          71.300 cm/s AV VTI:            0.216 m AV Peak Grad:      4.5 mmHg AV Mean Grad:      2.0 mmHg LVOT Vmax:         97.50 cm/s LVOT Vmean:        59.700 cm/s LVOT VTI:          0.183 m LVOT/AV VTI ratio: 0.85  AORTA Ao Root diam: 3.10 cm Ao Asc diam:  2.90 cm MITRAL VALVE MV Area (PHT): 3.12 cm    SHUNTS MV Area VTI:   1.71 cm    Systemic VTI:  0.18 m MV Peak grad:  4.7 mmHg    Systemic Diam: 2.00 cm MV Mean grad:  2.0 mmHg MV Vmax:       1.08 m/s MV Vmean:      62.3 cm/s MV Decel Time: 243 msec MV E velocity: 99.60 cm/s MV A velocity: 79.10 cm/s MV E/A ratio:  1.26 Glori Bickers MD Electronically signed by Glori Bickers MD Signature Date/Time: 03/23/2022/11:48:36 AM    Final    CARDIAC CATHETERIZATION  Result Date: 03/23/2022 Images from the original result were not included.   Mid LAD lesion is 50% stenosed.   2nd Mrg lesion is 90% stenosed.   RPDA-2 lesion is 95% stenosed.   RPDA-1 lesion is 75% stenosed.   Previously placed Dist RCA stent of unknown type is  widely patent.   A drug-eluting stent was successfully placed using a STENT ONYX FRONTIER 2.0X15.   Post intervention, there is a 0% residual stenosis.   Post intervention, there is a 0% residual stenosis.   There is moderate left ventricular systolic dysfunction.   LV end diastolic pressure is moderately elevated.   The left ventricular ejection fraction is 45-50% by visual estimate. Francisco Dodson is a 56 y.o. male  [id number] LOCATION:  FACILITY: [facility] PHYSICIAN: Quay Burow, M.D. [birth year] DATE OF PROCEDURE:  03/23/2022 DATE OF DISCHARGE: CARDIAC CATHETERIZATION / PCI DES PDA History obtained from chart review.  56 year old  Caucasian male with prior history of CAD status post distal RCA intervention by Dr. Claiborne Billings 04/23/2020 in the setting of inferior STEMI.  He did have moderately severe disease of an obtuse marginal branch and moderate disease of his LAD at that time with an inferior wall motion abnormality.  He was discharged home on dual antiplatelet therapy.  He failed to take his medications, continues to smoke and did not  show up to outpatient follow-up.  He developed chest pain this evening and presented to [facility] where his EKG was consistent with inferior STEMI and he was transported for urgent cath. PROCEDURE DESCRIPTION: The patient was brought to the second floor [facility] cath lab in the postabsorptive state. He was not premedicated . His right wrist was prepped and shaved in usual sterile fashion. Xylocaine 1% was used for local anesthesia. A 6 French sheath was inserted into the right radial artery using standard Seldinger technique. The patient received 4000 units  of heparin intravenously.  A 5 [place] TIG catheter was used for selective coronary angiography and obtain left heart pressures.  Isovue dye is used for the entirety of the case (120 cc contrast total to patient).  Retrograde aortic, ventricular and pullback pressures were recorded.  Radial cocktail was administered via the SideArm sheath.  (LVEDP was 35). The patient received an additional 5000 units of heparin with an ACT of 359.  Isovue dye is used for the entirety of the intervention.  Retrograde ordered pressures monitored to the case.  Patient did receive 180 mg of p.o. Brilinta. Using a 6 [place] JR4 guide catheter along with a 0.14 [place] guidewire and a 2 mm x 12 mm balloon the PDA was dilated.  Overlapping 2 mm x 15 mm long Medtronic Onyx frontier drug-eluting stents were then placed from the ostium of the PDA into the midportion encompassing the diseased segments deployed at 12 to 14 atm resulting in reduction of a 95% mid PDA and  long 70% ostial/proximal PDA to 0% residual.  The patient tolerated procedure well.  There were no hemodynamic or electrocardiographic sequelae.  He was pain-free at the end of the case.   Successful PTA PCI drug-eluting stenting in the setting of inferior STEMI.  There was TIMI-3 flow however at the beginning of the case.  The previously placed stent in the distal RCA was patent.  The long disease in his obtuse marginal branch has progressed now in the 95% range.  His EF is in the 35 to 40% range with inferior hypokinesia and L elevated LVEDP.  He will need DAPT uninterrupted for 12 months.  Medications compliance will be an issue since he did not take his medications nor did he show up for follow-up visits after his last intervention.  We discussed the importance of smoking cessation.  The radial sheath was removed and a TR band was placed on the right wrist to achieve patent hemostasis.  The patient left lab in stable condition. Quay Burow. MD, [facility] 03/23/2022 4:20 AM     Disposition   Pt is being discharged home today in good condition.  Follow-up Plans & Appointments     Follow-up Information     Ledora Bottcher, [place] Follow up on 03/31/2022.   Specialties: Physician Assistant, Cardiology, Radiology Why: at 2:20pm for your follow up appt with Dr. Ermalinda Memos' PA Contact information: [address] [place]  [phone]                Discharge Instructions     Amb Referral to Cardiac Rehabilitation   Complete by: As directed    Diagnosis:  Coronary Stents STEMI     After initial evaluation and assessments completed: Virtual Based Care may be provided alone or in conjunction with Phase 2 Cardiac Rehab based on patient barriers.: Yes       Discharge Medications   Allergies as of 03/24/2022  Reactions   Penicillins Other (See Comments)   Childhood allergy         Medication List     STOP taking these medications    potassium  chloride SA 20 MEQ tablet Commonly known as: KLOR-CON M       TAKE these medications    aspirin 81 MG chewable tablet Chew 1 tablet (81 mg total) by mouth daily.   atorvastatin 80 MG tablet Commonly known as: LIPITOR Take 1 tablet (80 mg total) by mouth daily.   metoprolol tartrate 25 MG tablet Commonly known as: LOPRESSOR Take 1 tablet (25 mg total) by mouth 2 (two) times daily. What changed: how much to take   nitroGLYCERIN 0.4 MG SL tablet Commonly known as: NITROSTAT Place 1 tablet (0.4 mg total) under the tongue every 5 (five) minutes as needed for chest pain.   ticagrelor 90 MG Tabs tablet Commonly known as: BRILINTA Take 1 tablet (90 mg total) by mouth 2 (two) times daily.         Outstanding Labs/Studies   FLP/LFTs in 8 weeks   Duration of Discharge Encounter   Greater than 30 minutes including physician time.  Signed, Reino Bellis, NP 03/24/2022, 11:34 AM  I have personally seen and examined this patient. I agree with the assessment and plan as outlined above.  Doing well today post PCI of the RCA.  Smoking cessation encouraged.  Discharge home today. Follow up in [place] office.   Lauree Chandler 03/24/2022 11:34 AM

## 2022-03-24 NOTE — Telephone Encounter (Signed)
-----   Message from Lindsay B Roberts, NP sent at 03/24/2022 11:36 AM EDT ----- Regarding: TOC call Needs TOC call please  

## 2022-03-24 NOTE — Progress Notes (Signed)
Went over discharge paper work with patient. Answered all questions. PIVs removed. All belongings at bedside.

## 2022-03-24 NOTE — Telephone Encounter (Signed)
-----   Message from Arty Baumgartner, NP sent at 03/24/2022 11:36 AM EDT ----- Regarding: TOC call Needs TOC call please

## 2022-03-24 NOTE — Progress Notes (Signed)
CARDIAC REHAB PHASE I   PRE:  Rate/Rhythm: 73 SR  BP:  Sitting: 125/79      SaO2: 97 RA  MODE:  Ambulation: 470 ft   POST:  Rate/Rhythm: 87 SR  BP:  Sitting: 135/105 --> 126/81    SaO2: 98 RA   Pt ambulated 482ft in hallway independently with steady gait. Pt denies CP, SOB, or dizziness throughout. Pt educated on importance of ASA and Brilinta. Pt given MI book, stent card, and heart healthy diet. Encouraged smoking cessation. Reviewed site care, restrictions, and exercise guidelines. Will refer to CRP II Rebecca.  [phone] Francisco Bowen, RN BSN 03/24/2022 10:06 AM

## 2022-03-25 NOTE — Telephone Encounter (Signed)
Left message for pt to call.

## 2022-03-30 NOTE — Progress Notes (Deleted)
Cardiology Office Note:    Date:  03/30/2022   ID:  Francisco Dodson, DOB 1966/04/30, MRN [id number]  PCP:  Patient, No Pcp Per   [facility] Providers Cardiologist:  Nicki Guadalajara, MD { Click to update primary MD,subspecialty MD or APP then REFRESH:1}    Referring MD: No ref. provider found   No chief complaint on file. ***  History of Present Illness:    Francisco Dodson is a 56 y.o. male with a hx of CAD with prior STEMI treated with DES to RCA 2021, medical noncompliance, and tobacco abuse.  Echocardiogram at that time with an LVEF of 45 to 50%, RWMA, normal diastolic function, normal RV, no valvular disease.  Heart catheterization 04/2020 showed residual disease of 80% OM 2 and nonobstructive disease in LAD.  Of note there were plans to switch Brilinta to Plavix after the first 30 days.  It does not appear the patient followed up after this cath.  He was recently transferred from [facility] to [facility] on 03/23/2022 for inferior STEMI.  Heart catheterization resulted in PCI/DES x1 to the rPDA.  He was placed on DAPT with aspirin and Brilinta.  NSVT was treated with by increasing metoprolol.  LVEF 45% with akinesis of the LV, inferior/inferolateral/inferoseptal wall.  GDMT includes beta-blocker therapy.  Spironolactone was not added due to compliance issues    He presents today for TOC follow-up.    CAD Inferior STEMI 2021: DES-RCA Inferior STEMI 2023: DES-RPDA DAPT with aspirin and Brilinta x30 days Check affordability-May need to switch to Plavix with a 300 mg load   Ischemic cardiomyopathy Systolic heart failure LVEF of 45% with regional wall motion abnormality GDMT with beta-blocker Consider addition of spironolactone Was hypokalemic during his hospitalization   NSVT Continue beta-blocker    Past Medical History:  Diagnosis Date   ATTENTION DEFICIT DISORDER, HX OF    CHEST PAIN, ATYPICAL, HX OF    Coronary artery disease involving native coronary artery of native heart  with unstable angina pectoris (HCC) 04/23/2020   Multivessel CAD with luminal irregularity with narrowing up to 50% in the mid LAD; normal ramus immediate vessel; circumflex marginal stent stenosis in a small caliber vessel of 85% ostial and 80% in the midsegment; and 20% proximal RCA narrowing with total occlusion proximal to the PDA takeoff. - DES PCI of RCA, ~ med Rx of LCx-OM   DIZZINESS    ELECTROCARDIOGRAM, ABNORMAL    Hyperlipidemia with target LDL less than 70 04/24/2020   ST elevation myocardial infarction involving right coronary artery (HCC) 04/23/2020   PCI to the RCA with insertion of a 2.5 x 18 mm Resolute Onyx stent postdilated to 2.54 mm with the 100% occlusion reduced to 0% and restoration of TIMI-3 flow.    Past Surgical History:  Procedure Laterality Date   CORONARY STENT INTERVENTION N/A 03/23/2022   Procedure: CORONARY STENT INTERVENTION;  Surgeon: Runell Gess, MD;  Location: [facility] INVASIVE CV LAB;  Service: Cardiovascular;  Laterality: N/A;   CORONARY/GRAFT ACUTE MI REVASCULARIZATION N/A 04/23/2020   Procedure: Coronary/Graft Acute MI Revascularization;  Surgeon: Lennette Bihari, MD;  Location: [facility] INVASIVE CV LAB;  Service: Cardiovascular; Culprit lesion: 100% distal RCA (PCI-Resolute Onyx DES 2.5 x 18--2.6 mm)   LEFT HEART CATH AND CORONARY ANGIOGRAPHY N/A 04/23/2020   Procedure: LEFT HEART CATH AND CORONARY ANGIOGRAPHY;  Surgeon: Lennette Bihari, MD;  Location: [facility] INVASIVE CV LAB;  Service: Cardiovascular;  INF STEMI: CULPRIT 100% dRCA (DES PCI), 20% proximal RCA noted.  Proximal LAD 20% with mid LAD 50%.  Relatively small caliber OM branch 85 to 80% lesions -> per Dr. Tresa Endo, plan medical management   LEFT HEART CATH AND CORONARY ANGIOGRAPHY N/A 03/23/2022   Procedure: LEFT HEART CATH AND CORONARY ANGIOGRAPHY;  Surgeon: Runell Gess, MD;  Location: [facility] INVASIVE CV LAB;  Service: Cardiovascular;  Laterality: N/A;    Current Medications: No outpatient medications have  been marked as taking for the 03/31/22 encounter (Appointment) with Marcelino Duster, PA.     Allergies:   Penicillins   Social History   Socioeconomic History   Marital status: Single    Spouse name: Not on file   Number of children: Not on file   Years of education: Not on file   Highest education level: Not on file  Occupational History   Not on file  Tobacco Use   Smoking status: Every Day    Packs/day: 1.00    Years: 10.00    Total pack years: 10.00    Types: Cigarettes   Smokeless tobacco: Not on file  Substance and Sexual Activity   Alcohol use: No   Drug use: No   Sexual activity: Never    Birth control/protection: None  Other Topics Concern   Not on file  Social History Narrative   Not on file   Social Determinants of Health   Financial Resource Strain: Not on file  Food Insecurity: Not on file  Transportation Needs: Not on file  Physical Activity: Not on file  Stress: Not on file  Social Connections: Not on file     Family History: The patient's ***family history includes Heart attack in his brother, father, and mother; Heart disease in his brother, father, and mother; Stroke in his brother.  ROS:   Please see the history of present illness.    *** All other systems reviewed and are negative.  EKGs/Labs/Other Studies Reviewed:    The following studies were reviewed today: ***  EKG:  EKG is *** ordered today.  The ekg ordered today demonstrates ***  Recent Labs: 03/24/2022: BUN 14; Creatinine, Ser 1.11; Hemoglobin 13.9; Magnesium 2.0; Platelets 246; Potassium 3.1; Sodium 140  Recent Lipid Panel    Component Value Date/Time   CHOL 136 03/24/2022 0255   TRIG 85 03/24/2022 0255   TRIG 72 10/10/2004 0000   HDL 33 (L) 03/24/2022 0255   CHOLHDL 4.1 03/24/2022 0255   VLDL 17 03/24/2022 0255   LDLCALC 86 03/24/2022 0255   LDLDIRECT 153 (H) 02/05/2012 1545     Risk Assessment/Calculations:   {Does this patient have ATRIAL  FIBRILLATION?:[phone]}       Physical Exam:    VS:  There were no vitals taken for this visit.    Wt Readings from Last 3 Encounters:  03/24/22 182 lb 8.7 oz (82.8 kg)  04/23/20 190 lb 11.2 oz (86.5 kg)  01/25/16 180 lb (81.6 kg)     GEN: *** Well nourished, well developed in no acute distress HEENT: Normal NECK: No JVD; No carotid bruits LYMPHATICS: No lymphadenopathy CARDIAC: ***RRR, no murmurs, rubs, gallops RESPIRATORY:  Clear to auscultation without rales, wheezing or rhonchi  ABDOMEN: Soft, non-tender, non-distended MUSCULOSKELETAL:  No edema; No deformity  SKIN: Warm and dry NEUROLOGIC:  Alert and oriented x 3 PSYCHIATRIC:  Normal affect   ASSESSMENT:    No diagnosis found. PLAN:    In order of problems listed above:  ***  {The patient has an active order for outpatient cardiac rehabilitation.  Please indicate if the patient is ready to start. Do NOT delete this.  It will auto delete.  Refresh note, then sign.              Click here to document readiness and see contraindications.  :1}  Cardiac Rehabilitation Eligibility Assessment       {Are you ordering a CV Procedure (e.g. stress test, cath, DCCV, TEE, etc)?   Press F2        :[id number]}    Medication Adjustments/Labs and Tests Ordered: Current medicines are reviewed at length with the patient today.  Concerns regarding medicines are outlined above.  No orders of the defined types were placed in this encounter.  No orders of the defined types were placed in this encounter.   There are no Patient Instructions on file for this visit.   Signed, Marcelino Duster, [place]  03/30/2022 2:37 PM    [facility]

## 2022-03-31 ENCOUNTER — Ambulatory Visit: Payer: Medicare Other | Admitting: Physician Assistant

## 2022-04-02 ENCOUNTER — Ambulatory Visit: Payer: Medicare Other | Admitting: Physician Assistant

## 2022-04-07 NOTE — Progress Notes (Deleted)
Cardiology Office Note:    Date:  04/07/2022   ID:  Francisco Dodson, DOB 03/04/1966, MRN [id number]  PCP:  Patient, No Pcp Per  [facility] Cardiologist: Nicki Guadalajara, MD   Reason for visit: Follow-up STEMI  History of Present Illness:    [name] is a 56 y.o. male with a hx of inferior STEMI s/p DES to RCA in 2021, medical noncompliance and tobacco abuse who was transferred from [facility] with severe left-sided chest tightness while eating a meal associated with mild shortness of breath -similar to symptoms prior to his PCI in 2021.    Patient reported continue tobacco use daily. He did not take his medications daily. He does not follow with medical service routinely.  He underwent left heart catheterization with PCI to the RPDA.  Chest pain resolved.  EF was 45% with akinesis of the LV, inferior/inferolateral/inferoseptal wall. NSVT improved with metoprolol.    Today, ***  Coronary artery disease -Inferior STEMI in June 2023 with PCI/DES x1 to the rPDA.  -***  Ischemic cardiomyopathy -EF 45%  -*** (continue BB. Considered the addition of spiro but with compliance issues will defer)  Hypertension -*** -Goal BP is <130/80.  Recommend DASH diet (high in vegetables, fruits, low-fat dairy products, whole grains, poultry, fish, and nuts and low in sweets, sugar-sweetened beverages, and red meats), salt restriction and increase physical activity.  Hyperlipidemia -*** -Discussed cholesterol lowering diets - Mediterranean diet, DASH diet, vegetarian diet, low-carbohydrate diet and avoidance of trans fats.  Discussed healthier choice substitutes.  Nuts, high-fiber foods, and fiber supplements may also improve lipids.    Obesity -Discussed how even a 5-10% weight loss can have cardiovascular benefits.   -Recommend moderate intensity activity for 30 minutes 5 days/week and the DASH diet.  Tobacco use  -Recommend tobacco cessation.  Reviewed physiologic effects of nicotine  and the immediate-eventual benefits of quitting including improvement in cough/breathing and reduction in cardiovascular events.  Discussed quitting tips such as removing triggers and getting support from family/friends and [employer] [place]. -USPSTF recommends one-time screening for abdominal aortic aneurysm (AAA) by ultrasound in men 54 -36 years old who have ever smoked.      Disposition - Follow-up in ***    Past Medical History:  Diagnosis Date   ATTENTION DEFICIT DISORDER, HX OF    CHEST PAIN, ATYPICAL, HX OF    Coronary artery disease involving native coronary artery of native heart with unstable angina pectoris (HCC) 04/23/2020   Multivessel CAD with luminal irregularity with narrowing up to 50% in the mid LAD; normal ramus immediate vessel; circumflex marginal stent stenosis in a small caliber vessel of 85% ostial and 80% in the midsegment; and 20% proximal RCA narrowing with total occlusion proximal to the PDA takeoff. - DES PCI of RCA, ~ med Rx of LCx-OM   DIZZINESS    ELECTROCARDIOGRAM, ABNORMAL    Hyperlipidemia with target LDL less than 70 04/24/2020   ST elevation myocardial infarction involving right coronary artery (HCC) 04/23/2020   PCI to the RCA with insertion of a 2.5 x 18 mm Resolute Onyx stent postdilated to 2.54 mm with the 100% occlusion reduced to 0% and restoration of TIMI-3 flow.    Past Surgical History:  Procedure Laterality Date   CORONARY STENT INTERVENTION N/A 03/23/2022   Procedure: CORONARY STENT INTERVENTION;  Surgeon: Runell Gess, MD;  Location: [facility] INVASIVE CV LAB;  Service: Cardiovascular;  Laterality: N/A;   CORONARY/GRAFT ACUTE MI REVASCULARIZATION N/A 04/23/2020   Procedure: Coronary/Graft  Acute MI Revascularization;  Surgeon: Lennette Bihari, MD;  Location: [facility] INVASIVE CV LAB;  Service: Cardiovascular; Culprit lesion: 100% distal RCA (PCI-Resolute Onyx DES 2.5 x 18--2.6 mm)   LEFT HEART CATH AND CORONARY ANGIOGRAPHY N/A 04/23/2020   Procedure: LEFT HEART  CATH AND CORONARY ANGIOGRAPHY;  Surgeon: Lennette Bihari, MD;  Location: [facility] INVASIVE CV LAB;  Service: Cardiovascular;  INF STEMI: CULPRIT 100% dRCA (DES PCI), 20% proximal RCA noted.  Proximal LAD 20% with mid LAD 50%.  Relatively small caliber OM branch 85 to 80% lesions -> per Dr. Tresa Endo, plan medical management   LEFT HEART CATH AND CORONARY ANGIOGRAPHY N/A 03/23/2022   Procedure: LEFT HEART CATH AND CORONARY ANGIOGRAPHY;  Surgeon: Runell Gess, MD;  Location: [facility] INVASIVE CV LAB;  Service: Cardiovascular;  Laterality: N/A;    Current Medications: No outpatient medications have been marked as taking for the 04/08/22 encounter (Appointment) with Cannon Kettle, PA-C.     Allergies:   Penicillins   Social History   Socioeconomic History   Marital status: Single    Spouse name: Not on file   Number of children: Not on file   Years of education: Not on file   Highest education level: Not on file  Occupational History   Not on file  Tobacco Use   Smoking status: Every Day    Packs/day: 1.00    Years: 10.00    Total pack years: 10.00    Types: Cigarettes   Smokeless tobacco: Not on file  Substance and Sexual Activity   Alcohol use: No   Drug use: No   Sexual activity: Never    Birth control/protection: None  Other Topics Concern   Not on file  Social History Narrative   Not on file   Social Determinants of Health   Financial Resource Strain: Not on file  Food Insecurity: Not on file  Transportation Needs: Not on file  Physical Activity: Not on file  Stress: Not on file  Social Connections: Not on file     Family History: The patient's family history includes Heart attack in his brother, father, and mother; Heart disease in his brother, father, and mother; Stroke in his brother.  ROS:   Please see the history of present illness.     EKGs/Labs/Other Studies Reviewed:    EKG:  The ekg ordered today demonstrates ***  Recent Labs: 03/24/2022: BUN 14;  Creatinine, Ser 1.11; Hemoglobin 13.9; Magnesium 2.0; Platelets 246; Potassium 3.1; Sodium 140   Recent Lipid Panel Lab Results  Component Value Date/Time   CHOL 136 03/24/2022 02:55 AM   TRIG 85 03/24/2022 02:55 AM   TRIG 72 10/10/2004 12:00 AM   HDL 33 (L) 03/24/2022 02:55 AM   LDLCALC 86 03/24/2022 02:55 AM   LDLDIRECT 153 (H) 02/05/2012 03:45 PM    Physical Exam:    VS:  There were no vitals taken for this visit.   No data found.  Wt Readings from Last 3 Encounters:  03/24/22 182 lb 8.7 oz (82.8 kg)  04/23/20 190 lb 11.2 oz (86.5 kg)  01/25/16 180 lb (81.6 kg)     GEN: *** Well nourished, well developed in no acute distress HEENT: Normal NECK: No JVD; No carotid bruits CARDIAC: ***RRR, no murmurs, rubs, gallops RESPIRATORY:  Clear to auscultation without rales, wheezing or rhonchi  ABDOMEN: Soft, non-tender, non-distended MUSCULOSKELETAL: No edema; No deformity  SKIN: Warm and dry NEUROLOGIC:  Alert and oriented PSYCHIATRIC:  Normal affect  ASSESSMENT AND PLAN   ***   {Are you ordering a CV Procedure (e.g. stress test, cath, DCCV, TEE, etc)?   Press F2        :[id number]}    Medication Adjustments/Labs and Tests Ordered: Current medicines are reviewed at length with the patient today.  Concerns regarding medicines are outlined above.  No orders of the defined types were placed in this encounter.  No orders of the defined types were placed in this encounter.   There are no Patient Instructions on file for this visit.   Signed, Cannon Kettle, PA-C  04/07/2022 7:40 AM    [facility]

## 2022-04-08 ENCOUNTER — Ambulatory Visit: Payer: Medicare Other | Admitting: Physician Assistant

## 2022-04-08 DIAGNOSIS — I2510 Atherosclerotic heart disease of native coronary artery without angina pectoris: Secondary | ICD-10-CM

## 2022-04-08 DIAGNOSIS — I255 Ischemic cardiomyopathy: Secondary | ICD-10-CM

## 2022-04-08 DIAGNOSIS — Z720 Tobacco use: Secondary | ICD-10-CM

## 2022-04-10 ENCOUNTER — Telehealth (HOSPITAL_COMMUNITY): Payer: Self-pay

## 2022-04-10 NOTE — Telephone Encounter (Signed)
Transitions of Care Pharmacy  ° °Call attempted for a pharmacy transitions of care follow-up. HIPAA appropriate voicemail was left with call back information provided.  ° °Call attempt #1. Will follow-up in 2-3 days.  °  °

## 2022-04-13 ENCOUNTER — Encounter: Payer: Self-pay | Admitting: Physician Assistant

## 2022-04-14 ENCOUNTER — Telehealth (HOSPITAL_COMMUNITY): Payer: Self-pay

## 2022-04-14 ENCOUNTER — Other Ambulatory Visit (HOSPITAL_COMMUNITY): Payer: Self-pay

## 2022-04-14 NOTE — Telephone Encounter (Signed)
Pharmacy Transitions of Care Follow-up Telephone Call  Date of discharge: 03/24/2022  Discharge Diagnosis: STEMI  How have you been since you were released from the hospital? Patient has been doing well since discharge, but he has been having difficulties getting in touch with the cardiologist to get appointment.   Medication changes made at discharge: CHANGE how you take: metoprolol tartrate (LOPRESSOR)   STOP taking: potassium chloride SA 20 MEQ tablet (KLOR-CON M)   Medication changes verified by the patient? Yes    Medication Accessibility:  Home Pharmacy: [facility]    Was the patient provided with refills on discharged medications? Yes   Have all prescriptions been transferred from [facility] to home pharmacy? Yes   Is the patient able to afford medications? Has insurance, Medicare   Medication Review:  Ticagrelor 90 mg BID initiated on 03/23/22.  - Discussed importance of taking medication around the same time every day, - Advised patient of medications to avoid (NSAIDs, aspirin maintenance doses>100 mg daily) - Educated that Tylenol (acetaminophen) will be the preferred analgesic to prevent risk of bleeding  - Emphasized importance of monitoring for signs and symptoms of bleeding (abnormal bruising, prolonged bleeding, nose bleeds, bleeding from gums, discolored urine, black tarry stools)  - Educated patient to notify doctor if shortness of breath or abnormal heartbeat occur - Advised patient to alert all providers of antiplatelet therapy prior to starting a new medication or having a procedure   Follow-up Appointments:  [facility]t confirmed? Seen Francisco Crumble, PA on 04/08/2022   If their condition worsens, is the pt aware to call PCP or go to the Emergency Dept.? Yes  Final Patient Assessment: Patient has refills at home pharmacy and has already had follow-up. Called Cardiologist office to inform them to contact patient regarding his next visit.

## 2022-04-14 NOTE — Telephone Encounter (Signed)
Transitions of Care Pharmacy   Call attempted for a pharmacy transitions of care follow-up. HIPAA appropriate voicemail was left with call back information provided.   Call attempt #2. Will follow-up in 2-3 days.    

## 2022-04-23 ENCOUNTER — Ambulatory Visit: Payer: Medicare Other | Admitting: Physician Assistant

## 2022-04-30 ENCOUNTER — Telehealth: Payer: Self-pay | Admitting: Cardiovascular Disease

## 2022-04-30 MED ORDER — METOPROLOL TARTRATE 25 MG PO TABS: 25.0000 mg | ORAL_TABLET | Freq: Two times a day (BID) | ORAL | 1 refills | Status: DC

## 2022-04-30 MED ORDER — ATORVASTATIN CALCIUM 80 MG PO TABS: 80.0000 mg | ORAL_TABLET | Freq: Every day | ORAL | 1 refills | Status: DC

## 2022-04-30 MED ORDER — TICAGRELOR 90 MG PO TABS: 90.0000 mg | ORAL_TABLET | Freq: Two times a day (BID) | ORAL | 2 refills | Status: DC

## 2022-04-30 NOTE — Telephone Encounter (Signed)
*  STAT* If patient is at the pharmacy, call can be transferred to refill team.   1. Which medications need to be refilled? (please list name of each medication and dose if known) atorvastatin (LIPITOR) 80 MG tablet  metoprolol tartrate (LOPRESSOR) 25 MG tablet  ticagrelor (BRILINTA) 90 MG TABS tablet  2. Which pharmacy/location (including street and city if local pharmacy) is medication to be sent to? [facility] - [place], [place] - [address]  3. Do they need a 30 day or 90 day supply? 90

## 2022-05-07 ENCOUNTER — Other Ambulatory Visit (HOSPITAL_COMMUNITY): Payer: Self-pay

## 2022-07-02 ENCOUNTER — Other Ambulatory Visit (HOSPITAL_COMMUNITY): Payer: Self-pay

## 2022-10-27 ENCOUNTER — Telehealth: Payer: Self-pay | Admitting: Cardiovascular Disease

## 2022-10-27 MED ORDER — METOPROLOL TARTRATE 25 MG PO TABS: 25.0000 mg | ORAL_TABLET | Freq: Two times a day (BID) | ORAL | 3 refills | Status: DC

## 2022-10-27 MED ORDER — TICAGRELOR 90 MG PO TABS: 90.0000 mg | ORAL_TABLET | Freq: Two times a day (BID) | ORAL | 3 refills | Status: DC

## 2022-10-27 MED ORDER — ATORVASTATIN CALCIUM 80 MG PO TABS: 80.0000 mg | ORAL_TABLET | Freq: Every day | ORAL | 3 refills | Status: DC

## 2022-10-27 NOTE — Telephone Encounter (Signed)
*  STAT* If patient is at the pharmacy, call can be transferred to refill team.   1. Which medications need to be refilled? (please list name of each medication and dose if known)   atorvastatin (LIPITOR) 80 MG tablet    metoprolol tartrate (LOPRESSOR) 25 MG tablet    ticagrelor (BRILINTA) 90 MG TABS tablet   2. Which pharmacy/location (including street and city if local pharmacy) is medication to be sent to? [place] [place]   3. Do they need a 30 day or 90 day supply?  90 day     Pt would like a call once refill request has been sent to pharmacy

## 2022-10-28 ENCOUNTER — Other Ambulatory Visit: Payer: Self-pay

## 2022-10-28 MED ORDER — TICAGRELOR 90 MG PO TABS: 90.0000 mg | ORAL_TABLET | Freq: Two times a day (BID) | ORAL | 3 refills | Status: DC

## 2022-12-04 ENCOUNTER — Ambulatory Visit: Payer: Medicare HMO | Attending: Medical | Admitting: Medical

## 2022-12-04 NOTE — Progress Notes (Deleted)
Cardiology Office Note:    Date:  12/04/2022   ID:  Francisco Dodson, DOB 18-Jul-1966, MRN [id number]  PCP:  Patient, No Pcp Per  [facility] Cardiologist:  Francisco Majestic, MD  [place] Electrophysiologist:  None   Referring MD: No ref. provider found   Chief Complaint: 6 month follow-up/hosp f/u  History of Present Illness:    Francisco Dodson is a 57 y.o. male with a hx of inferior STEMI s/p DES to RCA in 2021, medical noncompliance and tobacco abuse who presents for hosp follow-up.   The patient has a history of CAD with DES to RCA in 2021. The patient was admitted June 2023 for chest pain with associated SOB. He had been off all his medications. EKG showed SR with inferior STEMI and he was taken urgently to the cath lab. He underwent cardiac cath showed PCI/DES x1 to the rPDA. Recommendations for DAPT with ASA/Brilinta for at least 1 year. Echo showed LVEF 45% with akinesis of the LV, inferior/inferolateral/inferoseptal wall. Patient was lost to follow-up.   Today,   Past Medical History:  Diagnosis Date   ATTENTION DEFICIT DISORDER, HX OF    CHEST PAIN, ATYPICAL, HX OF    Coronary artery disease involving native coronary artery of native heart with unstable angina pectoris ([place]) 04/23/2020   Multivessel CAD with luminal irregularity with narrowing up to 50% in the mid LAD; normal ramus immediate vessel; circumflex marginal stent stenosis in a small caliber vessel of 85% ostial and 80% in the midsegment; and 20% proximal RCA narrowing with total occlusion proximal to the PDA takeoff. - DES PCI of RCA, ~ med Rx of LCx-OM   DIZZINESS    ELECTROCARDIOGRAM, ABNORMAL    Hyperlipidemia with target LDL less than 70 04/24/2020   ST elevation myocardial infarction involving right coronary artery ([place]) 04/23/2020   PCI to the RCA with insertion of a 2.5 x 18 mm Resolute Onyx stent postdilated to 2.54 mm with the 100% occlusion reduced to 0% and restoration of TIMI-3 flow.    Past Surgical History:   Procedure Laterality Date   CORONARY STENT INTERVENTION N/A 03/23/2022   Procedure: CORONARY STENT INTERVENTION;  Surgeon: Francisco Harp, MD;  Location: [facility];  Service: Cardiovascular;  Laterality: N/A;   CORONARY/GRAFT ACUTE MI REVASCULARIZATION N/A 04/23/2020   Procedure: Coronary/Graft Acute MI Revascularization;  Surgeon: Francisco Sine, MD;  Location: [facility];  Service: Cardiovascular; Culprit lesion: 100% distal RCA (PCI-Resolute Onyx DES 2.5 x 18--2.6 mm)   LEFT HEART CATH AND CORONARY ANGIOGRAPHY N/A 04/23/2020   Procedure: LEFT HEART CATH AND CORONARY ANGIOGRAPHY;  Surgeon: Francisco Sine, MD;  Location: [place];  Service: Cardiovascular;  INF STEMI: CULPRIT 100% dRCA (DES PCI), 20% proximal RCA noted.  Proximal LAD 20% with mid LAD 50%.  Relatively small caliber OM branch 85 to 80% lesions -> per Dr. Claiborne Billings, plan medical management   LEFT HEART CATH AND CORONARY ANGIOGRAPHY N/A 03/23/2022   Procedure: LEFT HEART CATH AND CORONARY ANGIOGRAPHY;  Surgeon: Francisco Harp, MD;  Location: [facility];  Service: Cardiovascular;  Laterality: N/A;    Current Medications: No outpatient medications have been marked as taking for the 12/04/22 encounter (Appointment) with Kathlen Mody, Thelton Graca H, PA-C.     Allergies:   Penicillins   Social History   Socioeconomic History   Marital status: Single    Spouse name: Not on file   Number of children: Not on file  Years of education: Not on file   Highest education level: Not on file  Occupational History   Not on file  Tobacco Use   Smoking status: Every Day    Packs/day: 1.00    Years: 10.00    Total pack years: 10.00    Types: Cigarettes   Smokeless tobacco: Not on file  Substance and Sexual Activity   Alcohol use: No   Drug use: No   Sexual activity: Never    Birth control/protection: None  Other Topics Concern   Not on file  Social History Narrative   Not on file   Social Determinants of  Health   Financial Resource Strain: Not on file  Food Insecurity: Not on file  Transportation Needs: Not on file  Physical Activity: Not on file  Stress: Not on file  Social Connections: Not on file     Family History: The patient's family history includes Heart attack in his brother, father, and mother; Heart disease in his brother, father, and mother; Stroke in his brother.  ROS:   Please see the history of present illness.     All other systems reviewed and are negative.  EKGs/Labs/Other Studies Reviewed:    The following studies were reviewed today:  Echo 03/2022 1. Left ventricular ejection fraction, by estimation, is 45%. The left  ventricle has normal function. The left ventricle demonstrates regional  wall motion abnormalities (see scoring diagram/findings for description).  Left ventricular diastolic  parameters are consistent with Grade I diastolic dysfunction (impaired  relaxation). There is akinesis of the left ventricular, entire inferior  wall, inferolateral wall and inferoseptal wall.   2. Right ventricular systolic function is normal. The right ventricular  size is normal.   3. The mitral valve is normal in structure. Trivial mitral valve  regurgitation. No evidence of mitral stenosis.   4. The aortic valve is normal in structure. Aortic valve regurgitation is  not visualized. No aortic stenosis is present.   5. The inferior vena cava is normal in size with greater than 50%  respiratory variability, suggesting right atrial pressure of 3 mmHg.   Cardiac cath 03/2022    Mid LAD lesion is 50% stenosed.   2nd Mrg lesion is 90% stenosed.   RPDA-2 lesion is 95% stenosed.   RPDA-1 lesion is 75% stenosed.   Previously placed Dist RCA stent of unknown type is  widely patent.   A drug-eluting stent was successfully placed using a STENT ONYX FRONTIER 2.0X15.   Post intervention, there is a 0% residual stenosis.   Post intervention, there is a 0% residual stenosis.    There is moderate left ventricular systolic dysfunction.   LV end diastolic pressure is moderately elevated.   The left ventricular ejection fraction is 45-50% by visual estimate.   Francisco Dodson is a 57 y.o. male      AY:[id number] LOCATION:  FACILITY: [facility]  PHYSICIAN: Quay Burow, M.D. 1966-04-23     DATE OF PROCEDURE:  03/23/2022   DATE OF DISCHARGE:        CARDIAC CATHETERIZATION / PCI DES PDA       History obtained from chart review.  57 year old Caucasian male with prior history of CAD status post distal RCA intervention by Dr. Claiborne Billings 04/23/2020 in the setting of inferior STEMI.  He did have moderately severe disease of an obtuse marginal branch and moderate disease of his LAD at that time with an inferior wall motion abnormality.  He was discharged home on dual antiplatelet therapy.  He failed to take his medications, continues to smoke and did not show up to outpatient follow-up.  He developed chest pain this evening and presented to [facility] where his EKG was consistent with inferior STEMI and he was transported for urgent cath.      IMPRESSION: Successful PTA PCI drug-eluting stenting in the setting of inferior STEMI.  There was TIMI-3 flow however at the beginning of the case.  The previously placed stent in the distal RCA was patent.  The long disease in his obtuse marginal branch has progressed now in the 95% range.  His EF is in the 35 to 40% range with inferior hypokinesia and L elevated LVEDP.  He will need DAPT uninterrupted for 12 months.  Medications compliance will be an issue since he did not take his medications nor did he show up for follow-up visits after his last intervention.  We discussed the importance of smoking cessation.  The radial sheath was removed and a TR band was placed on the right wrist to achieve patent hemostasis.  The patient left lab in stable condition.   Quay Burow. MD, [facility] 03/23/2022 4:20 AM   Coronary  Diagrams  Diagnostic Dominance: Right  Intervention       EKG:  EKG is *** ordered today.  The ekg ordered today demonstrates ***  Recent Labs: 03/24/2022: BUN 14; Creatinine, Ser 1.11; Hemoglobin 13.9; Magnesium 2.0; Platelets 246; Potassium 3.1; Sodium 140  Recent Lipid Panel    Component Value Date/Time   CHOL 136 03/24/2022 0255   TRIG 85 03/24/2022 0255   TRIG 72 10/10/2004 0000   HDL 33 (L) 03/24/2022 0255   CHOLHDL 4.1 03/24/2022 0255   VLDL 17 03/24/2022 0255   LDLCALC 86 03/24/2022 0255   LDLDIRECT 153 (H) 02/05/2012 1545     Risk Assessment/Calculations:   {Does this patient have ATRIAL FIBRILLATION?:[id number]}   Physical Exam:    VS:  There were no vitals taken for this visit.    Wt Readings from Last 3 Encounters:  03/24/22 182 lb 8.7 oz (82.8 kg)  04/23/20 190 lb 11.2 oz (86.5 kg)  01/25/16 180 lb (81.6 kg)     GEN: *** Well nourished, well developed in no acute distress HEENT: Normal NECK: No JVD; No carotid bruits LYMPHATICS: No lymphadenopathy CARDIAC: ***RRR, no murmurs, rubs, gallops RESPIRATORY:  Clear to auscultation without rales, wheezing or rhonchi  ABDOMEN: Soft, non-tender, non-distended MUSCULOSKELETAL:  No edema; No deformity  SKIN: Warm and dry NEUROLOGIC:  Alert and oriented x 3 PSYCHIATRIC:  Normal affect   ASSESSMENT:    No diagnosis found. PLAN:    In order of problems listed above:  CAD  HTN  Tobacco use  Noncompliance   NSVT  Disposition: Follow up {follow up:15908} with ***   Shared Decision Making/Informed Consent   {Are you ordering a CV Procedure (e.g. stress test, cath, DCCV, TEE, etc)?   Press F2        :[id number]    Signed, Tyleigh Mahn Ninfa Meeker, PA-C  12/04/2022 11:06 AM    [facility]

## 2022-12-07 ENCOUNTER — Encounter: Payer: Self-pay | Admitting: Medical

## 2023-01-25 NOTE — Progress Notes (Unsigned)
Cardiology Office Note    Date:  01/26/2023   ID:  Francisco Dodson, DOB 1966/08/01, MRN [id number]  PCP:  Patient, No Pcp Per  Cardiologist:  Bryan Lemma, MD  Electrophysiologist:  None   Chief Complaint: f/u CAD  History of Present Illness:   Francisco Dodson is a 57 y.o. male with history of CAD with inferior STEMI s/p DES to RCA in 2021 with repeat inferior STEMI 03/2022 s/p DES to rPDA, ICM EF 45%, NSVT, hypokalemia medical noncompliance, tobacco abuse who presents for overdue follow-up.   He was originally seen by our team in 2021 for his first MI. He did not return to clinic for follow-up. He was then readmitted 03/2022 with recurrent MI with PTCA/DES to PDA, otherwise medical therapy recommended for residual disease (Cath as outlined below). Echo showed EF 45%, akinesis of the left ventricular, entire inferior wall, inferolateral wall and inferoseptal wall, G1DD. He was discharged on guideline directed therapy. He did not return for follow-up post MI. He reports that he originally wanted to leave it up to the [place].  He returns for follow-up feeling great without any CP or SOB. Initial BP 144/82, recheck by me after seated for a while 128/72. He feels this is likely due to eating salty sausage breakfast earlier today, drinking 2 diet Mtn Dews, and being nervous for this appointment. He reports adherence with regimen and is hesitant to make any changes.    Labwork independently reviewed: 03/2022 Mg 2.0, A1C 5.1, LDL 86, trig 85, CBC wnl, K 3.1, Cr 1.11, albumin 3.4, AST ALT wnl  Past Medical History:  Diagnosis Date   ATTENTION DEFICIT DISORDER, HX OF    CHEST PAIN, ATYPICAL, HX OF    Coronary artery disease involving native coronary artery of native heart with unstable angina pectoris 04/23/2020   Multivessel CAD with luminal irregularity with narrowing up to 50% in the mid LAD; normal ramus immediate vessel; circumflex marginal stent stenosis in a small caliber vessel of 85% ostial and 80%  in the midsegment; and 20% proximal RCA narrowing with total occlusion proximal to the PDA takeoff. - DES PCI of RCA, ~ med Rx of LCx-OM   DIZZINESS    ELECTROCARDIOGRAM, ABNORMAL    Hyperlipidemia with target LDL less than 70 04/24/2020   ST elevation myocardial infarction involving right coronary artery 04/23/2020   PCI to the RCA with insertion of a 2.5 x 18 mm Resolute Onyx stent postdilated to 2.54 mm with the 100% occlusion reduced to 0% and restoration of TIMI-3 flow.    Past Surgical History:  Procedure Laterality Date   CORONARY STENT INTERVENTION N/A 03/23/2022   Procedure: CORONARY STENT INTERVENTION;  Surgeon: Runell Gess, MD;  Location: [facility];  Service: Cardiovascular;  Laterality: N/A;   CORONARY/GRAFT ACUTE MI REVASCULARIZATION N/A 04/23/2020   Procedure: Coronary/Graft Acute MI Revascularization;  Surgeon: Lennette Bihari, MD;  Location: [facility];  Service: Cardiovascular; Culprit lesion: 100% distal RCA (PCI-Resolute Onyx DES 2.5 x 18--2.6 mm)   LEFT HEART CATH AND CORONARY ANGIOGRAPHY N/A 04/23/2020   Procedure: LEFT HEART CATH AND CORONARY ANGIOGRAPHY;  Surgeon: Lennette Bihari, MD;  Location: [facility];  Service: Cardiovascular;  INF STEMI: CULPRIT 100% dRCA (DES PCI), 20% proximal RCA noted.  Proximal LAD 20% with mid LAD 50%.  Relatively small caliber OM branch 85 to 80% lesions -> per Dr. Tresa Endo, plan medical management   LEFT HEART CATH AND CORONARY ANGIOGRAPHY N/A 03/23/2022  Procedure: LEFT HEART CATH AND CORONARY ANGIOGRAPHY;  Surgeon: Runell Gess, MD;  Location: [facility];  Service: Cardiovascular;  Laterality: N/A;    Current Medications: Current Meds  Medication Sig   aspirin 81 MG chewable tablet Chew 1 tablet (81 mg total) by mouth daily.   atorvastatin (LIPITOR) 80 MG tablet Take 1 tablet (80 mg total) by mouth daily.   metoprolol tartrate (LOPRESSOR) 25 MG tablet Take 1 tablet (25 mg total) by mouth 2 (two)  times daily.   nitroGLYCERIN (NITROSTAT) 0.4 MG SL tablet Place 1 tablet (0.4 mg total) under the tongue every 5 (five) minutes as needed for chest pain.   ticagrelor (BRILINTA) 90 MG TABS tablet Take 1 tablet (90 mg total) by mouth 2 (two) times daily.      Allergies:   Penicillins   Social History   Socioeconomic History   Marital status: Single    Spouse name: Not on file   Number of children: Not on file   Years of education: Not on file   Highest education level: Not on file  Occupational History   Not on file  Tobacco Use   Smoking status: Every Day    Packs/day: 1.00    Years: 10.00    Additional pack years: 0.00    Total pack years: 10.00    Types: Cigarettes   Smokeless tobacco: Not on file  Vaping Use   Vaping Use: Never used  Substance and Sexual Activity   Alcohol use: No   Drug use: No   Sexual activity: Never    Birth control/protection: None  Other Topics Concern   Not on file  Social History Narrative   Not on file   Social Determinants of Health   Financial Resource Strain: Not on file  Food Insecurity: Not on file  Transportation Needs: Not on file  Physical Activity: Not on file  Stress: Not on file  Social Connections: Not on file     Family History:  The patient's family history includes Heart attack in his brother, father, and mother; Heart disease in his brother, father, and mother; Stroke in his brother.  ROS:   Please see the history of present illness. All other systems are reviewed and otherwise negative.    EKG(s)/Additional Testing   EKG:  EKG is ordered today, personally reviewed, demonstrating NSR 84bpm, prior inferior infarct, cannot r/o prior anterior infarct, nonspecific STTW Changes. Aside from TWI V6, appears largely unchanged.  CV Studies: Cardiac studies reviewed are outlined and summarized above. Otherwise please see EMR for full report.  Cath report 03/2022  PROCEDURE DESCRIPTION:    The patient was brought to  the second floor [facility] cath lab in the postabsorptive state. He was not premedicated . His right wrist was prepped and shaved in usual sterile fashion. Xylocaine 1% was used for local anesthesia. A 6 French sheath was inserted into the right radial artery using standard Seldinger technique. The patient received 4000 units  of heparin intravenously.  A 5 [place] TIG catheter was used for selective coronary angiography and obtain left heart pressures.  Isovue dye is used for the entirety of the case (120 cc contrast total to patient).  Retrograde aortic, ventricular and pullback pressures were recorded.  Radial cocktail was administered via the SideArm sheath.  (LVEDP was 35).   The patient received an additional 5000 units of heparin with an ACT of 359.  Isovue dye is used for the entirety of the intervention.  Retrograde ordered pressures monitored to the case.  Patient did receive 180 mg of p.o. Brilinta.  Using a 6 [place] JR4 guide catheter along with a 0.14 [place] guidewire and a 2 mm x 12 mm balloon the PDA was dilated.  Overlapping 2 mm x 15 mm long Medtronic Onyx frontier drug-eluting stents were then placed from the ostium of the PDA into the midportion encompassing the diseased segments deployed at 12 to 14 atm resulting in reduction of a 95% mid PDA and long 70% ostial/proximal PDA to 0% residual.  The patient tolerated procedure well.  There were no hemodynamic or electrocardiographic sequelae.  He was pain-free at the end of the case.     IMPRESSION: Successful PTA PCI drug-eluting stenting in the setting of inferior STEMI.  There was TIMI-3 flow however at the beginning of the case.  The previously placed stent in the distal RCA was patent.  The long disease in his obtuse marginal branch has progressed now in the 95% range.  His EF is in the 35 to 40% range with inferior hypokinesia and L elevated LVEDP.  He will need DAPT uninterrupted for 12 months.  Medications compliance will be  an issue since he did not take his medications nor did he show up for follow-up visits after his last intervention.  We discussed the importance of smoking cessation.  The radial sheath was removed and a TR band was placed on the right wrist to achieve patent hemostasis.  The patient left lab in stable condition.   Nanetta Batty. MD, [facility] 03/23/2022 4:20 AM   Diagnostic Dominance: Right Left Anterior Descending  Mid LAD lesion is 50% stenosed.    Left Circumflex    Second Obtuse Marginal Branch  2nd Mrg lesion is 90% stenosed.    Right Coronary Artery  Previously placed Dist RCA stent of unknown type is widely patent.    Right Posterior Descending Artery  RPDA-1 lesion is 75% stenosed.  RPDA-2 lesion is 95% stenosed.    Intervention   RPDA-1 lesion  Stent (Also treats lesions: RPDA-2)  Lesion crossed with guidewire. Pre-stent angioplasty was performed. A drug-eluting stent was successfully placed using a STENT ONYX FRONTIER 2.0X15. Stent strut is well apposed.  Post-Intervention Lesion Assessment  The intervention was successful. Pre-interventional TIMI flow is 3. Post-intervention TIMI flow is 3. No complications occurred at this lesion.  There is a 0% residual stenosis post intervention.    RPDA-2 lesion  Stent (Also treats lesions: RPDA-1)  See details in RPDA-1 lesion.  Post-Intervention Lesion Assessment  The intervention was successful. Pre-interventional TIMI flow is 3. Post-intervention TIMI flow is 3. No complications occurred at this lesion.  There is a 0% residual stenosis post intervention.      Recent Labs: 03/24/2022: BUN 14; Creatinine, Ser 1.11; Hemoglobin 13.9; Magnesium 2.0; Platelets 246; Potassium 3.1; Sodium 140  Recent Lipid Panel    Component Value Date/Time   CHOL 136 03/24/2022 0255   TRIG 85 03/24/2022 0255   TRIG 72 10/10/2004 0000   HDL 33 (L) 03/24/2022 0255   CHOLHDL 4.1 03/24/2022 0255   VLDL 17 03/24/2022 0255   LDLCALC 86 03/24/2022  0255   LDLDIRECT 153 (H) 02/05/2012 1545    PHYSICAL EXAM:    VS:  BP (!) 144/82   Pulse 84   Ht  (1.778 m)   Wt 186 lb (84.4 kg)   SpO2 97%   BMI 26.69 kg/m   BMI: Body mass index is 26.69 kg/m.  GEN: Well nourished, well developed male in no acute distress HEENT: normocephalic, atraumatic Neck: no JVD, carotid bruits, or masses Cardiac: RRR; no murmurs, rubs, or gallops, no edema  Respiratory:  clear to auscultation bilaterally, normal work of breathing GI: soft, nontender, nondistended, + BS MS: no deformity or atrophy, right radial cath site without hematoma or ecchymosis; good pulse. Skin: warm and dry, no rash Neuro:  Alert and Oriented x 3, Strength and sensation are intact, follows commands Psych: euthymic mood, full affect  Wt Readings from Last 3 Encounters:  01/26/23 186 lb (84.4 kg)  03/24/22 182 lb 8.7 oz (82.8 kg)  04/23/20 190 lb 11.2 oz (86.5 kg)     ASSESSMENT & PLAN:   1. CAD - doing well, no recent complaints. Continue ASA, Brilinta, metoprolol, atorvastatin. I will send msg to Dr. Herbie Baltimore to inquire on duration of DAPT since coming up on 12 months from last PCI in 03/2023, though has had 2 MI events with residual disease as above. Check basic labs including lipids + direct LDL today.  2. Ischemic cardiomyopathy, NSVT - appears euvolemic. Will recheck echo to re-evaluate LVEF. If EF is still decreased, consider intensification of GDMT.  3. Hypokalemia - recheck labs today.  4. Tobacco abuse - has cut down but not fully quit. Cessation encouraged.  5. Elevated BP without dx of HTN - Initial BP 144/82, recheck by me after seated for a while 128/72. He cites salty breakfast, diet Mtn Dew and nerves contributing to the increase. The patient was instructed to monitor their blood pressure at home and to call if tending to run higher than 130/80 at home. Discussed getting a BP cuff for home. Will also add baseline TSH to labs.    Disposition: F/u with Dr.  Jenene Slicker in 6 months to establish care. Patient verbalized understanding of importance of med adherence and follow-up.   Medication Adjustments/Labs and Tests Ordered: Current medicines are reviewed at length with the patient today.  Concerns regarding medicines are outlined above. Medication changes, Labs and Tests ordered today are summarized above and listed in the Patient Instructions accessible in Encounters.    Signed, Laurann Montana, PA-C  01/26/2023 3:34 PM    [facility] [place] [facility] [address] [place], [place] [address] Ph: [phone]; Fax [phone]

## 2023-01-26 ENCOUNTER — Ambulatory Visit: Payer: Medicare HMO | Attending: Medical | Admitting: Physician Assistant

## 2023-01-26 ENCOUNTER — Telehealth: Payer: Self-pay | Admitting: Physician Assistant

## 2023-01-26 ENCOUNTER — Encounter: Payer: Self-pay | Admitting: Physician Assistant

## 2023-01-26 VITALS — BP 128/72 | HR 84 | Ht 70.0 in | Wt 186.0 lb

## 2023-01-26 DIAGNOSIS — I255 Ischemic cardiomyopathy: Secondary | ICD-10-CM | POA: Diagnosis not present

## 2023-01-26 DIAGNOSIS — I2510 Atherosclerotic heart disease of native coronary artery without angina pectoris: Secondary | ICD-10-CM

## 2023-01-26 DIAGNOSIS — R030 Elevated blood-pressure reading, without diagnosis of hypertension: Secondary | ICD-10-CM

## 2023-01-26 DIAGNOSIS — Z720 Tobacco use: Secondary | ICD-10-CM | POA: Diagnosis not present

## 2023-01-26 DIAGNOSIS — Z79899 Other long term (current) drug therapy: Secondary | ICD-10-CM

## 2023-01-26 DIAGNOSIS — E876 Hypokalemia: Secondary | ICD-10-CM

## 2023-01-26 NOTE — Telephone Encounter (Addendum)
Sending this msg to Dr. Herbie Baltimore: Patient seen for overdue f/u. Has CAD with inferior STEMI s/p DES to RCA in 2021 with repeat inferior STEMI 03/2022 s/p DES to rPDA. Despite two prior MIs this is his first time visiting the office. He reports adherence to med regimen since his last MI. He is clinically doing great. His 1 year anniversary of last PCI is coming up soon, though had residual disease as well. Wanted to know your input on whether to continue ASA + Brilinta versus whether we need to change to Plavix. I am getting basic f/u labs but he states he is happy to continue and hesitant to change any of his regimen at this time.   Also need OK from Dr. Herbie Baltimore and Dr. Jenene Slicker per office protocol to switch due to living in [facility] - will establish with Dr. Jenene Slicker in 6 months if OK with MDs.

## 2023-01-26 NOTE — Patient Instructions (Addendum)
Please monitor your blood pressure occasionally at home. Call your doctor if you tend to get readings of greater than 130 on the top number or 80 on the bottom number.     Medication Instructions:  Your physician recommends that you continue on your current medications as directed. Please refer to the Current Medication list given to you today.   Labwork: Lab work today at [facility]  Testing/Procedures: Your physician has requested that you have an echocardiogram. Echocardiography is a painless test that uses sound waves to create images of your heart. It provides your doctor with information about the size and shape of your heart and how well your heart's chambers and valves are working. This procedure takes approximately one hour. There are no restrictions for this procedure. Please do NOT wear cologne, perfume, aftershave, or lotions (deodorant is allowed). Please arrive 15 minutes prior to your appointment time.   Follow-Up: 6 months Dr.Mallipeddi in the [place] office  Any Other Special Instructions Will Be Listed Below (If Applicable).  If you need a refill on your cardiac medications before your next appointment, please call your pharmacy.

## 2023-01-26 NOTE — Telephone Encounter (Signed)
That makes sense --  As for DAPT --> stop ASA @ 1 yr post STEMI -- > either reduce to 60 mg BID Brilinta (maintenance dose), or change to Plavix 75 mg daily.  With 2 MIs - would recommend using Thienopyridine monotherapy for prophylaxis as opposed to aspirin. At 1 year post PCI, okay to hold the maintenance dose 5 to 7 days preop for surgical procedures.  Bryan Lemma, MD

## 2023-01-28 ENCOUNTER — Telehealth: Payer: Self-pay | Admitting: *Deleted

## 2023-01-28 LAB — TEST AUTHORIZATION

## 2023-01-28 LAB — CBC
HCT: 46.4 % (ref 38.5–50.0)
Hemoglobin: 15.9 g/dL (ref 13.2–17.1)
MCH: 31.2 pg (ref 27.0–33.0)
MCHC: 34.3 g/dL (ref 32.0–36.0)
MCV: 91.2 fL (ref 80.0–100.0)
MPV: 10.8 fL (ref 7.5–12.5)
Platelets: 239 10*3/uL (ref 140–400)
RBC: 5.09 10*6/uL (ref 4.20–5.80)
RDW: 12.9 % (ref 11.0–15.0)
WBC: 7.8 10*3/uL (ref 3.8–10.8)

## 2023-01-28 LAB — LIPID PANEL
Cholesterol: 148 mg/dL (ref <200)
HDL: 43 mg/dL (ref >=40)
LDL Cholesterol (Calc): 83 mg/dL (calc)
Non-HDL Cholesterol (Calc): 105 mg/dL (calc) (ref <130)
Total CHOL/HDL Ratio: 3.4 (calc) (ref <5.0)
Triglycerides: 128 mg/dL (ref <150)

## 2023-01-28 LAB — COMPREHENSIVE METABOLIC PANEL
AG Ratio: 1.4 (calc) (ref 1.0–2.5)
ALT: 11 U/L (ref 9–46)
AST: 12 U/L (ref 10–35)
Albumin: 4 g/dL (ref 3.6–5.1)
Alkaline phosphatase (APISO): 76 U/L (ref 35–144)
BUN/Creatinine Ratio: 11 (calc) (ref 6–22)
BUN: 15 mg/dL (ref 7–25)
CO2: 32 mmol/L (ref 20–32)
Calcium: 9.5 mg/dL (ref 8.6–10.3)
Chloride: 101 mmol/L (ref 98–110)
Creat: 1.31 mg/dL — ABNORMAL HIGH (ref 0.70–1.30)
Globulin: 2.8 g/dL (calc) (ref 1.9–3.7)
Glucose, Bld: 88 mg/dL (ref 65–99)
Potassium: 3.5 mmol/L (ref 3.5–5.3)
Sodium: 141 mmol/L (ref 135–146)
Total Bilirubin: 0.5 mg/dL (ref 0.2–1.2)
Total Protein: 6.8 g/dL (ref 6.1–8.1)

## 2023-01-28 LAB — TSH: TSH: 1.46 mIU/L (ref 0.40–4.50)

## 2023-01-28 LAB — LDL CHOLESTEROL, DIRECT: Direct LDL: 87 mg/dL (ref <100)

## 2023-01-28 LAB — MAGNESIUM: Magnesium: 2.1 mg/dL (ref 1.5–2.5)

## 2023-01-28 NOTE — Telephone Encounter (Addendum)
Sending lab results with recommendations in one consolidated phone note.  Please let pt know:  - labs stable except kidney function slightly more abnormal than prior - not on any cardiac meds that can cause this so recommend establishing with a PCP to follow this - LDL much improved from previous but not at goal at 87 - recommend adding ezetimibe  once daily with fasting lipid panel and CMET in 2 months - I reviewed blood thinner plan with Dr. Herbie Baltimore. The patient should continue ASA + Brilinta  BID for now. After he passes the the 1 year anniversary of his last heart attack (which would be 03/24/2023), he may stop aspirin and either:  a) decrease Brilinta  BID to Brilinta  twice daily or b) if he prefers to change Brilinta to clopidogrel/Plavix due to cost or twice-daily dosing, stop Brilinta and start clopidogrel/Plavix  once daily. I confirmed with Dr. Herbie Baltimore that he would not need a Plavix load, can simply start  once a day the morning after last PM Brilinta dose.  Thank you!

## 2023-01-28 NOTE — Telephone Encounter (Signed)
-----   Message from Laurann Montana, [place] sent at 01/28/2023  7:43 AM EDT ----- Patient was supposed to have lipid profile with this lab but only the direct LDL is still in process. Can they make sure it's been run? If not, recommend returning for fasting lipid panel as well as ordered. This is the pt we may have sent to [facility] that day since it was after 3:30. Thanks.

## 2023-01-28 NOTE — Telephone Encounter (Signed)
Spoke with Francisco Dodson at [place]. She will add a lipid to labs. She states that the turn around time is 1-2 days.

## 2023-01-29 ENCOUNTER — Encounter: Payer: Self-pay | Admitting: Physician Assistant

## 2023-01-29 NOTE — Telephone Encounter (Signed)
Left a message for patient to call office back regarding medication changes.  

## 2023-02-01 MED ORDER — EZETIMIBE 10 MG PO TABS: 10.0000 mg | ORAL_TABLET | Freq: Every day | ORAL | 3 refills | Status: DC

## 2023-02-01 NOTE — Telephone Encounter (Signed)
Patient notified and verbalized understanding. 

## 2023-02-01 NOTE — Telephone Encounter (Signed)
Patient has not decided between Brilinta/Plavix. Patient stated he would let our office know which he decides.

## 2023-02-02 ENCOUNTER — Other Ambulatory Visit: Payer: Self-pay | Admitting: Physician Assistant

## 2023-02-02 DIAGNOSIS — R030 Elevated blood-pressure reading, without diagnosis of hypertension: Secondary | ICD-10-CM

## 2023-02-02 DIAGNOSIS — E876 Hypokalemia: Secondary | ICD-10-CM

## 2023-02-02 DIAGNOSIS — Z720 Tobacco use: Secondary | ICD-10-CM

## 2023-02-02 DIAGNOSIS — I255 Ischemic cardiomyopathy: Secondary | ICD-10-CM

## 2023-02-02 DIAGNOSIS — I2510 Atherosclerotic heart disease of native coronary artery without angina pectoris: Secondary | ICD-10-CM

## 2023-02-22 ENCOUNTER — Other Ambulatory Visit: Payer: Medicare HMO

## 2023-04-09 ENCOUNTER — Ambulatory Visit (HOSPITAL_COMMUNITY)
Admission: RE | Admit: 2023-04-09 | Discharge: 2023-04-09 | Disposition: A | Discharge status: Home / Self Care | Payer: Medicare HMO | Source: Ambulatory Visit | Attending: Physician Assistant | Admitting: Physician Assistant

## 2023-04-09 DIAGNOSIS — I255 Ischemic cardiomyopathy: Secondary | ICD-10-CM

## 2023-04-09 DIAGNOSIS — I2510 Atherosclerotic heart disease of native coronary artery without angina pectoris: Secondary | ICD-10-CM

## 2023-04-09 DIAGNOSIS — I428 Other cardiomyopathies: Secondary | ICD-10-CM

## 2023-04-09 LAB — ECHOCARDIOGRAM COMPLETE
Area-P 1/2: 4.39 cm2
Calc EF: 43.7 %
S' Lateral: 4.5 cm
Single Plane A2C EF: 42.4 %
Single Plane A4C EF: 45.6 %

## 2023-04-09 NOTE — Progress Notes (Signed)
  Echocardiogram 2D Echocardiogram has been performed.  Francisco Dodson 04/09/2023, 9:27 AM

## 2023-04-13 ENCOUNTER — Telehealth: Payer: Self-pay | Admitting: Physician Assistant

## 2023-04-13 NOTE — Telephone Encounter (Signed)
Patient notified and verbalized understanding. Patient agreeable to OV w/ DD

## 2023-04-13 NOTE — Telephone Encounter (Signed)
Patient is returning call to discuss echo results. °

## 2023-04-13 NOTE — Telephone Encounter (Signed)
Laurann Montana, PA-C 04/09/2023  3:28 PM EDT     Please let patient know echo is stable though still shows chronically reduced heart pump function. Normal is 55-65, his is 40-45, consistent with the diagnosis of congestive heart failure. Recommend return OV to discuss further medication titration -- please have him check BP at least once a day and bring to that visit. Per 01/26/23 phone note, he was also supposed to make a decision regarding adjusting Brilinta vs changing to Plavix but had not. If he has not yet made a decision, can discuss at OV. If he is willing to come to [place], I have openings later this month. If it's an AM appt, can come fasting to visit and we can plan to update lipids/CMET then.

## 2023-04-21 NOTE — Progress Notes (Deleted)
   Cardiology Office Note    Date:  04/21/2023  ID:  Francisco Dodson, DOB [birth year], MRN [id number] PCP:  Patient, No Pcp Per  Cardiologist:  Francisco Lemma, MD  Electrophysiologist:  None   Chief Complaint: ***  History of Present Illness: Francisco Dodson Kitchen    Francisco Dodson is a 57 y.o. 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  He was originally seen by our team in 2021 for his first MI. He did not return to clinic for follow-up initially. He was then readmitted 03/2022 with recurrent MI with PTCA/DES to PDA, otherwise medical therapy recommended for residual disease (Cath as outlined below). Echo showed EF 45%, akinesis of the left ventricular, entire inferior wall, inferolateral wall and inferoseptal wall, G1DD. He was discharged on guideline directed therapy. He did not return for follow-up post MI until recent OV 01/26/23. He reports that he originally wanted to leave it up to the [facility]. We added Zetia for suboptimally controlled LDL and relayed recommendations for antiplatelet options (now post 1 yr out MI) per discussion with Dr. Herbie Baltimore though he had not yet made a decision. Repeat echo 04/09/23 showed EF 40-45%, mild MR, recommended to return for formal discussion of GDMT and antiplatelet plan.   LFTs/CMET  CAD HLD ICM, NSVT, Chronic HFmrEF Tobacco abuse     Labwork independently reviewed: 01/2023 LDL 83, trig 128, dLDL 87, TSH wnl, CBC wnl, Mg wnl, Cr 1.31, K 3.5, LFTs ok  ROS: Please see the history of present illness. Otherwise, review of systems is positive for ***.  All other systems are reviewed and otherwise negative.   Studies Reviewed: Francisco Dodson Kitchen    EKG:  EKG is ordered today, personally reviewed, demonstrating ***  CV Studies: Cardiac studies reviewed are outlined and summarized above. Otherwise  please see EMR for full report.  Physical Exam:    VS:  There were no vitals taken for this visit.   Wt Readings from Last 3 Encounters:  01/26/23 186 lb (84.4 kg)  03/24/22 182 lb 8.7 oz (82.8 kg)  04/23/20 190 lb 11.2 oz (86.5 kg)    GEN: Well nourished, well developed in no acute distress NECK: No JVD; No carotid bruits CARDIAC: ***RRR, no murmurs, rubs, gallops RESPIRATORY:  Clear to auscultation without rales, wheezing or rhonchi  ABDOMEN: Soft, non-tender, non-distended EXTREMITIES:  No edema; No acute deformity   Asessement and Plan:.     ***     Disposition: F/u with ***  Signed, Laurann Montana, PA-C

## 2023-04-22 ENCOUNTER — Ambulatory Visit: Payer: Medicare HMO | Attending: Physician Assistant | Admitting: Physician Assistant

## 2023-04-22 DIAGNOSIS — E785 Hyperlipidemia, unspecified: Secondary | ICD-10-CM

## 2023-04-22 DIAGNOSIS — I5022 Chronic systolic (congestive) heart failure: Secondary | ICD-10-CM

## 2023-04-22 DIAGNOSIS — Z720 Tobacco use: Secondary | ICD-10-CM

## 2023-04-22 DIAGNOSIS — I2510 Atherosclerotic heart disease of native coronary artery without angina pectoris: Secondary | ICD-10-CM

## 2023-04-22 DIAGNOSIS — I255 Ischemic cardiomyopathy: Secondary | ICD-10-CM

## 2023-04-22 DIAGNOSIS — I4729 Other ventricular tachycardia: Secondary | ICD-10-CM

## 2023-07-06 NOTE — Progress Notes (Deleted)
   Cardiology Office Note    Date:  07/06/2023  ID:  Francisco Dodson, DOB [birth year], MRN [id number] PCP:  Patient, No Pcp Per  Cardiologist:  Bryan Lemma, MD  Electrophysiologist:  None   Chief Complaint: ***  History of Present Illness: [place] Kitchen    Francisco Dodson is a 57 y.o. 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  He was originally seen by our team in 2021 for his first MI. He did not return to clinic for follow-up initially. He was then readmitted 03/2022 with recurrent MI with PTCA/DES to PDA, otherwise medical therapy recommended for residual disease (Cath as outlined below). Echo showed EF 45%, akinesis of the left ventricular, entire inferior wall, inferolateral wall and inferoseptal wall, G1DD. He was discharged on guideline directed therapy. He did not return for follow-up post MI until recent OV 01/26/23. He reports that he originally wanted to leave it up to the [facility]. We added Zetia for suboptimally controlled LDL. We also relayed recommendations for antiplatelet options (now post 1 yr out MI) per discussion with Dr. Herbie Baltimore in f/u phone note, though he had not yet made a decision. Repeat echo 04/09/23 showed EF 40-45%, mild MR. It was recommended to return for formal discussion of GDMT and antiplatelet plan. He did not show for the initial follow-up.   LFTs/CMET  CAD HLD ICM, NSVT, Chronic HFmrEF Tobacco abuse  Labwork independently reviewed: 01/2023 LDL 83, trig 128, dLDL 87, TSH wnl, CBC wnl, Mg wnl, Cr 1.31, K 3.5, LFTs ok    ROS: .    Please see the history of present illness. Otherwise, review of systems is positive for ***.  All other systems are reviewed and otherwise negative.  Studies Reviewed: [place] Kitchen    EKG:  EKG is ordered today, personally reviewed, demonstrating ***  CV  Studies: Cardiac studies reviewed are outlined and summarized above. Otherwise please see EMR for full report.   Current Reported Medications:.    No outpatient medications have been marked as taking for the 07/07/23 encounter (Appointment) with Laurann Montana, PA-C.    Physical Exam:    VS:  There were no vitals taken for this visit.   Wt Readings from Last 3 Encounters:  01/26/23 186 lb (84.4 kg)  03/24/22 182 lb 8.7 oz (82.8 kg)  04/23/20 190 lb 11.2 oz (86.5 kg)    GEN: Well nourished, well developed in no acute distress NECK: No JVD; No carotid bruits CARDIAC: ***RRR, no murmurs, rubs, gallops RESPIRATORY:  Clear to auscultation without rales, wheezing or rhonchi  ABDOMEN: Soft, non-tender, non-distended EXTREMITIES:  No edema; No acute deformity   Asessement and Plan:.     ***     Disposition: F/u with ***  Signed, Laurann Montana, PA-C

## 2023-07-07 ENCOUNTER — Ambulatory Visit: Payer: Medicare HMO | Admitting: Physician Assistant

## 2023-07-07 DIAGNOSIS — I4729 Other ventricular tachycardia: Secondary | ICD-10-CM

## 2023-07-07 DIAGNOSIS — Z720 Tobacco use: Secondary | ICD-10-CM

## 2023-07-07 DIAGNOSIS — I255 Ischemic cardiomyopathy: Secondary | ICD-10-CM

## 2023-07-07 DIAGNOSIS — I2510 Atherosclerotic heart disease of native coronary artery without angina pectoris: Secondary | ICD-10-CM

## 2023-07-07 DIAGNOSIS — E785 Hyperlipidemia, unspecified: Secondary | ICD-10-CM

## 2023-07-07 DIAGNOSIS — I5022 Chronic systolic (congestive) heart failure: Secondary | ICD-10-CM

## 2023-07-26 ENCOUNTER — Telehealth: Payer: Self-pay | Admitting: Cardiology

## 2023-07-26 NOTE — Telephone Encounter (Signed)
Pt would like a c/b regarding left arm pain and feels like electrical shock. For several months Please advise.

## 2023-07-26 NOTE — Telephone Encounter (Signed)
Spoke to patient who stated for the last 1-2 months he has been having pain from his left  elbow down to his wrist. He also reports having what feels like electrical shock in the same arm. He denies any chest pain, shortness of breath or dizziness at this time. He also report the pain is sometime in the shoulder. Advised patient to reach out to PCP for evaluation.

## 2023-10-18 NOTE — Progress Notes (Deleted)
   Cardiology Office Note    Date:  10/18/2023  ID:  Francisco Dodson, DOB [birth year], MRN [id number] PCP:  Patient, No Pcp Per  Cardiologist:  Randene Bustard, MD  Electrophysiologist:  None   Chief Complaint: ***  History of Present Illness: Francisco Dodson    Francisco Dodson is a 58 y.o. 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  He was originally seen by our team in 2021 for his first MI. He did not return to clinic for follow-up initially. He was then readmitted 03/2022 with recurrent MI with PTCA/DES to PDA, otherwise medical therapy recommended for residual disease (Cath as outlined below). Echo showed EF 45%, akinesis of the left ventricular, entire inferior wall, inferolateral wall and inferoseptal wall, G1DD. He was discharged on guideline directed therapy. He did not return for follow-up post MI until recent OV 01/26/23. He reports that he originally wanted to leave it up to the [facility]. We added Zetia  for suboptimally controlled LDL. We also relayed recommendations for antiplatelet options (now post 1 yr out MI) per discussion with Dr. Addie Holstein in f/u phone note, though he had not yet made a decision. Repeat echo 04/09/23 showed EF 40-45%, mild MR. It was recommended to return for formal discussion of GDMT and antiplatelet plan.   LFTs/CMET Lipid panel  CAD HLD ICM, NSVT, Chronic HFmrEF Tobacco abuse   Labwork independently reviewed: 01/2023 LDL 83, trig 128, dLDL 87, TSH wnl, CBC wnl, Mg wnl, Cr 1.31, K 3.5, LFTs ok   ROS: .    Please see the history of present illness. Otherwise, review of systems is positive for ***.  All other systems are reviewed and otherwise negative.  Studies Reviewed: Francisco Dodson    EKG:  EKG is ordered today, personally reviewed, demonstrating ***  CV Studies: Cardiac studies reviewed  are outlined and summarized above. Otherwise please see EMR for full report.   Current Reported Medications:.    No outpatient medications have been marked as taking for the 10/20/23 encounter (Appointment) with Alfa Leibensperger N, PA-C.    Physical Exam:    VS:  There were no vitals taken for this visit.   Wt Readings from Last 3 Encounters:  01/26/23 186 lb (84.4 kg)  03/24/22 182 lb 8.7 oz (82.8 kg)  04/23/20 190 lb 11.2 oz (86.5 kg)    GEN: Well nourished, well developed in no acute distress NECK: No JVD; No carotid bruits CARDIAC: ***RRR, no murmurs, rubs, gallops RESPIRATORY:  Clear to auscultation without rales, wheezing or rhonchi  ABDOMEN: Soft, non-tender, non-distended EXTREMITIES:  No edema; No acute deformity   Asessement and Plan:.     ***     Disposition: F/u with ***  Signed, Vedha Tercero N Stanislaw Acton, PA-C

## 2023-10-20 ENCOUNTER — Ambulatory Visit: Payer: Medicare HMO | Admitting: Physician Assistant

## 2023-10-20 DIAGNOSIS — I4729 Other ventricular tachycardia: Secondary | ICD-10-CM

## 2023-10-20 DIAGNOSIS — I255 Ischemic cardiomyopathy: Secondary | ICD-10-CM

## 2023-10-20 DIAGNOSIS — E785 Hyperlipidemia, unspecified: Secondary | ICD-10-CM

## 2023-10-20 DIAGNOSIS — Z720 Tobacco use: Secondary | ICD-10-CM

## 2023-10-20 DIAGNOSIS — I5022 Chronic systolic (congestive) heart failure: Secondary | ICD-10-CM

## 2023-10-20 DIAGNOSIS — I2510 Atherosclerotic heart disease of native coronary artery without angina pectoris: Secondary | ICD-10-CM

## 2023-10-21 ENCOUNTER — Inpatient Hospital Stay (HOSPITAL_COMMUNITY)
Admission: RE | Admit: 2023-10-21 | Discharge: 2023-10-21 | Disposition: A | Discharge status: Home / Self Care | Payer: Medicare HMO | Source: Ambulatory Visit

## 2023-11-22 ENCOUNTER — Other Ambulatory Visit: Payer: Self-pay | Admitting: Cardiovascular Disease

## 2024-04-28 ENCOUNTER — Telehealth: Payer: Self-pay | Admitting: Cardiology

## 2024-04-28 MED ORDER — METOPROLOL TARTRATE 25 MG PO TABS: 25.0000 mg | ORAL_TABLET | Freq: Two times a day (BID) | ORAL | 0 refills | Status: DC

## 2024-04-28 MED ORDER — ATORVASTATIN CALCIUM 80 MG PO TABS: 80.0000 mg | ORAL_TABLET | Freq: Every day | ORAL | 0 refills | Status: DC

## 2024-04-28 MED ORDER — TICAGRELOR 90 MG PO TABS: 90.0000 mg | ORAL_TABLET | Freq: Two times a day (BID) | ORAL | 0 refills | Status: DC

## 2024-04-28 MED ORDER — ASPIRIN 81 MG PO CHEW: 81.0000 mg | CHEWABLE_TABLET | Freq: Every day | ORAL | 0 refills | Status: AC

## 2024-04-28 MED ORDER — EZETIMIBE 10 MG PO TABS: 10.0000 mg | ORAL_TABLET | Freq: Every day | ORAL | 0 refills | Status: DC

## 2024-04-28 NOTE — Telephone Encounter (Signed)
*  STAT* If patient is at the pharmacy, call can be transferred to refill team.   1. Which medications need to be refilled? (please list name of each medication and dose if known)   aspirin  81 MG chewable tablet  atorvastatin  (LIPITOR ) 80 MG tablet  ezetimibe  (ZETIA ) 10 MG tablet (Expired)  metoprolol  tartrate (LOPRESSOR ) 25 MG tablet  ticagrelor  (BRILINTA ) 90 MG TABS tablet    2. Would you like to learn more about the convenience, safety, & potential cost savings by using the [facility]?   3. Are you open to using the [facility] (Type [facility]. ).  4. Which pharmacy/location (including street and city if local pharmacy) is medication to be sent to?  [id number] - [place], [place] - [address]   5. Do they need a 30 day or 90 day supply?     Patient stated he is almost out of these medications and has moved to [place].  Patient has appointment scheduled on 9/19.

## 2024-04-28 NOTE — Telephone Encounter (Signed)
 RX sent to requested Pharmacy

## 2024-06-06 ENCOUNTER — Other Ambulatory Visit: Payer: Self-pay | Admitting: Cardiology

## 2024-06-20 ENCOUNTER — Ambulatory Visit: Attending: Nurse Practitioner | Admitting: Nurse Practitioner

## 2024-06-20 ENCOUNTER — Encounter: Payer: Self-pay | Admitting: Nurse Practitioner

## 2024-06-20 VITALS — BP 116/72 | HR 74 | Ht 71.0 in | Wt 157.2 lb

## 2024-06-20 DIAGNOSIS — R9431 Abnormal electrocardiogram [ECG] [EKG]: Secondary | ICD-10-CM

## 2024-06-20 DIAGNOSIS — E876 Hypokalemia: Secondary | ICD-10-CM

## 2024-06-20 DIAGNOSIS — I255 Ischemic cardiomyopathy: Secondary | ICD-10-CM | POA: Diagnosis not present

## 2024-06-20 DIAGNOSIS — Z79899 Other long term (current) drug therapy: Secondary | ICD-10-CM | POA: Diagnosis not present

## 2024-06-20 DIAGNOSIS — Z758 Other problems related to medical facilities and other health care: Secondary | ICD-10-CM

## 2024-06-20 DIAGNOSIS — G4700 Insomnia, unspecified: Secondary | ICD-10-CM

## 2024-06-20 DIAGNOSIS — I2510 Atherosclerotic heart disease of native coronary artery without angina pectoris: Secondary | ICD-10-CM | POA: Diagnosis not present

## 2024-06-20 DIAGNOSIS — Z0189 Encounter for other specified special examinations: Secondary | ICD-10-CM

## 2024-06-20 MED ORDER — NITROGLYCERIN 0.4 MG SL SUBL: 0.4000 mg | SUBLINGUAL_TABLET | SUBLINGUAL | 2 refills | Status: AC | PRN

## 2024-06-20 NOTE — Patient Instructions (Addendum)
 Medication Instructions:  Your physician recommends that you continue on your current medications as directed. Please refer to the Current Medication list given to you today.  Labwork: In 1 week at [facility]   Testing/Procedures: None   Follow-Up: Your physician recommends that you schedule a follow-up appointment in:  1 week Nurse visit for an EKG  1 Year   Any Other Special Instructions Will Be Listed Below (If Applicable).  If you need a refill on your cardiac medications before your next appointment, please call your pharmacy.

## 2024-06-20 NOTE — Progress Notes (Signed)
 Cardiology Office Note   Date:  06/20/2024 ID:  Francisco Dodson, DOB [birth year], MRN [id number] PCP: Patient, No Pcp Per  [facility] Providers Cardiologist:  Alm Clay, MD     History of Present Illness Francisco Dodson is a 57 y.o. male with a PMH of CAD, ischemic cardiomyopathy, NSVT, tobacco abuse, hypokalemia, and medical noncompliance, who presents today for overdue follow-up.  Previous cardiovascular history includes inferior STEMI in 2021.  Received drug-eluting stent to RCA at the time.  Had a repeat inferior STEMI in June 2023, received drug-eluting stent to rPDA, EF at the time 45%.   Last seen by Raphael Bring, PA-C on January 26, 2023.  Was overall doing well at that time.  Today he presents for overdue follow-up. He admits to fatigue, not sleeping at night. Doing well from a cardiac perspective. Denies any acute cardiac complaints or issues. Denies any chest pain, shortness of breath, palpitations, syncope, presyncope, dizziness, orthopnea, PND, swelling or significant weight changes, acute bleeding, or claudication.   ROS: negative. See HPI. 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   Studies Reviewed  EKG: EKG Interpretation Date/Time:  Tuesday June 20 2024 08:35:15 EDT Ventricular Rate:  74 PR Interval:  170 QRS Duration:  130 QT Interval:  456 QTC Calculation: 506 R Axis:   67  Text Interpretation: Normal sinus rhythm Non-specific intra-ventricular conduction block Possible Inferior infarct (cited on or before 23-Mar-2022) When compared with ECG of 23-Mar-2022 04:30, QRS duration has increased Confirmed by Miriam Norris [phone]) on 06/20/2024 8:45:30 AM   Echo 04/2023:  1. Left ventricular ejection fraction, by estimation, is 40 to 45%. The  left ventricle has mildly decreased function. The left ventricle  demonstrates regional wall motion abnormalities (see scoring  diagram/findings for description). The left ventricular   internal cavity  size was mildly dilated. Left ventricular diastolic  parameters were normal.   2. Right ventricular systolic function is normal. The right ventricular  size is normal. Tricuspid regurgitation signal is inadequate for assessing  PA pressure.   3. The mitral valve is degenerative. Mild mitral valve regurgitation.   4. The aortic valve is tricuspid. Aortic valve regurgitation is not  visualized. No aortic stenosis is present.   5. Nodular calcification noted within thoracic aortic arch.   6. The inferior vena cava is normal in size with greater than 50%  respiratory variability, suggesting right atrial pressure of 3 mmHg.   Comparison(s): Prior images reviewed side by side. LVEF stable in range of  40-45% with wall motion abnormalities consistent with ischemic  cardiomyopathy.  LHC 03/2022: IMPRESSION: Successful PTA PCI drug-eluting stenting in the setting of inferior STEMI.  There was TIMI-3 flow however at the beginning of the case.  The previously placed stent in the distal RCA was patent.  The long disease in his obtuse marginal branch has progressed now in the 95% range.  His EF is in the 35 to 40% range with inferior hypokinesia and L elevated LVEDP.  He will need DAPT uninterrupted for 12 months.  Medications compliance will be an issue since he did not take his medications nor did he show up for follow-up visits after his last intervention.  We discussed the importance of smoking cessation.  The radial sheath was removed and a TR band was placed on the right wrist to achieve patent hemostasis.  The patient left lab in stable condition.   Physical Exam VS:  BP 116/72 (BP Location: Left Arm)  Pulse 74   Ht 5' 11 (1.803 m)   Wt 157 lb 3.2 oz (71.3 kg)   SpO2 98%   BMI 21.92 kg/m        Wt Readings from Last 3 Encounters:  06/20/24 157 lb 3.2 oz (71.3 kg)  01/26/23 186 lb (84.4 kg)  03/24/22 182 lb 8.7 oz (82.8 kg)    GEN: Well nourished, well developed in no acute  distress NECK: No JVD; No carotid bruits CARDIAC: S1/S2, RRR, no murmurs, rubs, gallops RESPIRATORY:  Clear to auscultation without rales, wheezing or rhonchi  ABDOMEN: Soft, non-tender, non-distended EXTREMITIES:  No edema; No deformity   ASSESSMENT AND PLAN  CAD Stable with no anginal symptoms. No indication for ischemic evaluation.  Continue current medication regimen.  Will obtain CBC, CMET, and FLP. Heart healthy diet and regular cardiovascular exercise encouraged. Will refill NTG.   ICM Stage C, NYHA class I symptoms. EF 40-45%. Euvolemic and well compensated on exam. Will obtain labs prior to GDMT titration. Low sodium diet, fluid restriction <2L, and daily weights encouraged. Educated to contact our office for weight gain of 2 lbs overnight or 5 lbs in one week.   Abnormal EKG, medication management EKG today shows QtcB around 506 ms. EKG reviewed with DOD, Dr. Debera who believes it is u waves in lead V2, will obtain labs: Mag, CMET, and thyroid panel. Will provider referral to PCP - see below. Will bring him back in 1 week for an EKG.  Insomnia, does not have a PCP Etiology multifactorial. Did recommend melatonin. Will provide referral to PCP for further evaluation.    Dispo: Follow-up with MD/APP in 1 year or sooner if anything changes.   Signed, Almarie Crate, NP

## 2024-06-23 ENCOUNTER — Ambulatory Visit: Admitting: Nurse Practitioner

## 2024-06-27 ENCOUNTER — Ambulatory Visit

## 2024-07-03 ENCOUNTER — Ambulatory Visit: Attending: Cardiology | Admitting: *Deleted

## 2024-07-03 DIAGNOSIS — R9431 Abnormal electrocardiogram [ECG] [EKG]: Secondary | ICD-10-CM

## 2024-07-03 NOTE — Progress Notes (Unsigned)
 Patient in office for EKG - see scanned in epic.

## 2024-07-06 ENCOUNTER — Telehealth: Payer: Self-pay | Admitting: Nurse Practitioner

## 2024-07-06 NOTE — Telephone Encounter (Signed)
 Pt came in to follow up on EKG results  Best number: [phone]

## 2024-07-07 NOTE — Telephone Encounter (Signed)
 Patient notified and verbalized understanding.   He c/o having some SOB & swelling in his feet and legs.  Has been going on since last office visit.  States that the sob is just a tiny amount but could happen at any time.  Says that he has stopped drinking sodas and changed to cranberry juice and water.  He questions if he needs any fluid medication.  Does also feel like he has mucous or phlegm in back of throat as well.    Informed provider out of office today & will reply next week.  He verbalized understanding.

## 2024-07-10 NOTE — Telephone Encounter (Signed)
 Patient notified and verbalized understanding.   States that he is feeling a lot better since he has stopped drinking soda.  Was drinking 8-10 / day.  Only drinking cranberry juice and water.  Reminded patient to get labs that was ordered at last office visit.  Office visit scheduled for December for sooner follow up than 1 year.

## 2024-07-18 ENCOUNTER — Ambulatory Visit: Payer: Self-pay | Admitting: Nurse Practitioner

## 2024-07-18 ENCOUNTER — Other Ambulatory Visit (HOSPITAL_COMMUNITY)
Admission: RE | Admit: 2024-07-18 | Discharge: 2024-07-18 | Disposition: A | Discharge status: Home / Self Care | Source: Ambulatory Visit | Attending: Nurse Practitioner | Admitting: Nurse Practitioner

## 2024-07-18 DIAGNOSIS — E860 Dehydration: Secondary | ICD-10-CM

## 2024-07-18 DIAGNOSIS — Z79899 Other long term (current) drug therapy: Secondary | ICD-10-CM | POA: Diagnosis present

## 2024-07-18 DIAGNOSIS — Z758 Other problems related to medical facilities and other health care: Secondary | ICD-10-CM | POA: Diagnosis present

## 2024-07-18 DIAGNOSIS — R7989 Other specified abnormal findings of blood chemistry: Secondary | ICD-10-CM

## 2024-07-18 DIAGNOSIS — I2510 Atherosclerotic heart disease of native coronary artery without angina pectoris: Secondary | ICD-10-CM | POA: Diagnosis present

## 2024-07-18 DIAGNOSIS — E876 Hypokalemia: Secondary | ICD-10-CM

## 2024-07-18 DIAGNOSIS — I255 Ischemic cardiomyopathy: Secondary | ICD-10-CM | POA: Insufficient documentation

## 2024-07-18 LAB — COMPREHENSIVE METABOLIC PANEL WITH GFR
ALT: 19 U/L (ref 0–44)
AST: 58 U/L — ABNORMAL HIGH (ref 15–41)
Albumin: 3.9 g/dL (ref 3.5–5.0)
Alkaline Phosphatase: 67 U/L (ref 38–126)
Anion gap: 11 (ref 5–15)
BUN: 14 mg/dL (ref 6–20)
CO2: 27 mmol/L (ref 22–32)
Calcium: 9.8 mg/dL (ref 8.9–10.3)
Chloride: 103 mmol/L (ref 98–111)
Creatinine, Ser: 1.32 mg/dL — ABNORMAL HIGH (ref 0.61–1.24)
GFR, Estimated: >60 mL/min (ref >60)
Glucose, Bld: 99 mg/dL (ref 70–99)
Potassium: 3.9 mmol/L (ref 3.5–5.1)
Sodium: 140 mmol/L (ref 135–145)
Total Bilirubin: 0.4 mg/dL (ref 0.0–1.2)
Total Protein: 7.5 g/dL (ref 6.5–8.1)

## 2024-07-18 LAB — CBC
HCT: 41.9 % (ref 39.0–52.0)
Hemoglobin: 13.3 g/dL (ref 13.0–17.0)
MCH: 31.4 pg (ref 26.0–34.0)
MCHC: 31.7 g/dL (ref 30.0–36.0)
MCV: 98.8 fL (ref 80.0–100.0)
Platelets: 318 K/uL (ref 150–400)
RBC: 4.24 MIL/uL (ref 4.22–5.81)
RDW: 14.6 % (ref 11.5–15.5)
WBC: 11.4 K/uL — ABNORMAL HIGH (ref 4.0–10.5)
nRBC: 0 % (ref 0.0–0.2)

## 2024-07-18 LAB — LIPID PANEL
Cholesterol: 167 mg/dL (ref 0–200)
HDL: 50 mg/dL (ref >40)
LDL Cholesterol: 102 mg/dL — ABNORMAL HIGH (ref 0–99)
Total CHOL/HDL Ratio: 3.4 ratio
Triglycerides: 78 mg/dL (ref <150)
VLDL: 16 mg/dL (ref 0–40)

## 2024-07-18 LAB — T4, FREE: Free T4: 1.11 ng/dL (ref 0.61–1.12)

## 2024-07-18 LAB — MAGNESIUM: Magnesium: 2.7 mg/dL — ABNORMAL HIGH (ref 1.7–2.4)

## 2024-07-18 LAB — TSH: TSH: 0.604 u[IU]/mL (ref 0.350–4.500)

## 2024-07-20 NOTE — Telephone Encounter (Signed)
 Pt returning call. Please advise.

## 2024-07-24 ENCOUNTER — Other Ambulatory Visit: Payer: Self-pay | Admitting: Cardiology

## 2024-07-27 ENCOUNTER — Other Ambulatory Visit: Payer: Self-pay | Admitting: Cardiology

## 2024-07-30 ENCOUNTER — Other Ambulatory Visit: Payer: Self-pay | Admitting: Cardiology

## 2024-09-11 ENCOUNTER — Ambulatory Visit: Admitting: Nurse Practitioner

## 2024-10-10 ENCOUNTER — Ambulatory Visit: Attending: Nurse Practitioner | Admitting: Nurse Practitioner

## 2024-10-10 ENCOUNTER — Encounter: Payer: Self-pay | Admitting: Nurse Practitioner

## 2024-10-10 VITALS — BP 134/74 | HR 68 | Ht 71.0 in | Wt 176.2 lb

## 2024-10-10 DIAGNOSIS — I255 Ischemic cardiomyopathy: Secondary | ICD-10-CM | POA: Diagnosis not present

## 2024-10-10 DIAGNOSIS — R7989 Other specified abnormal findings of blood chemistry: Secondary | ICD-10-CM | POA: Diagnosis not present

## 2024-10-10 DIAGNOSIS — R030 Elevated blood-pressure reading, without diagnosis of hypertension: Secondary | ICD-10-CM

## 2024-10-10 DIAGNOSIS — R9431 Abnormal electrocardiogram [ECG] [EKG]: Secondary | ICD-10-CM

## 2024-10-10 DIAGNOSIS — E876 Hypokalemia: Secondary | ICD-10-CM

## 2024-10-10 DIAGNOSIS — I2510 Atherosclerotic heart disease of native coronary artery without angina pectoris: Secondary | ICD-10-CM

## 2024-10-10 DIAGNOSIS — Z758 Other problems related to medical facilities and other health care: Secondary | ICD-10-CM | POA: Diagnosis not present

## 2024-10-10 DIAGNOSIS — Z79899 Other long term (current) drug therapy: Secondary | ICD-10-CM

## 2024-10-10 DIAGNOSIS — E785 Hyperlipidemia, unspecified: Secondary | ICD-10-CM

## 2024-10-10 DIAGNOSIS — Z7689 Persons encountering health services in other specified circumstances: Secondary | ICD-10-CM

## 2024-10-10 NOTE — Progress Notes (Signed)
 " Cardiology Office Note   Date: 10/10/2024 ID:  Francisco Dodson, DOB [birth year], MRN [id number] PCP: Patient, No Pcp Per  [facility] Providers Cardiologist:  Alm Clay, MD     History of Present Illness Francisco Dodson is a 59 y.o. male with a PMH of CAD, ischemic cardiomyopathy, NSVT, tobacco abuse, hypokalemia, and medical noncompliance, who presents today for scheduled follow-up.  Previous cardiovascular history includes inferior STEMI in 2021.  Received drug-eluting stent to RCA at the time.  Had a repeat inferior STEMI in June 2023, received drug-eluting stent to rPDA, EF at the time 45%.   I last saw patient on June 20, 2024. Continued to do well from a cardiac perspective.   10/11/2023 - He is here for follow-up today. Does not have a PCP. Denies any chest pain, shortness of breath, palpitations, syncope, presyncope, dizziness, orthopnea, PND, swelling or significant weight changes, acute bleeding, or claudication. Has not had previous labs done per his report d/t previous negative experience with a lab draw.   ROS: negative. See HPI. 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   Studies Reviewed  EKG:  EKG Interpretation Date/Time:  Tuesday October 10 2024 15:21:09 EST Ventricular Rate:  70 PR Interval:  174 QRS Duration:  106 QT Interval:  390 QTC Calculation: 421 R Axis:   80  Text Interpretation: Normal sinus rhythm Normal ECG When compared with ECG of 20-Jun-2024 08:35, QRS duration has decreased Borderline criteria for Inferior infarct are no longer Present Non-specific change in ST segment in Lateral leads Nonspecific T wave abnormality no longer evident in Anterior leads QT has shortened Confirmed by Miriam Norris [phone]) on 10/10/2024 3:24:05 PM   Echo 04/2023:  1. Left ventricular ejection fraction, by estimation, is 40 to 45%. The  left ventricle has mildly decreased function. The left ventricle  demonstrates regional wall motion abnormalities  (see scoring  diagram/findings for description). The left ventricular   internal cavity size was mildly dilated. Left ventricular diastolic  parameters were normal.   2. Right ventricular systolic function is normal. The right ventricular  size is normal. Tricuspid regurgitation signal is inadequate for assessing  PA pressure.   3. The mitral valve is degenerative. Mild mitral valve regurgitation.   4. The aortic valve is tricuspid. Aortic valve regurgitation is not  visualized. No aortic stenosis is present.   5. Nodular calcification noted within thoracic aortic arch.   6. The inferior vena cava is normal in size with greater than 50%  respiratory variability, suggesting right atrial pressure of 3 mmHg.   Comparison(s): Prior images reviewed side by side. LVEF stable in range of  40-45% with wall motion abnormalities consistent with ischemic  cardiomyopathy.  LHC 03/2022: IMPRESSION: Successful PTA PCI drug-eluting stenting in the setting of inferior STEMI.  There was TIMI-3 flow however at the beginning of the case.  The previously placed stent in the distal RCA was patent.  The long disease in his obtuse marginal branch has progressed now in the 95% range.  His EF is in the 35 to 40% range with inferior hypokinesia and L elevated LVEDP.  He will need DAPT uninterrupted for 12 months.  Medications compliance will be an issue since he did not take his medications nor did he show up for follow-up visits after his last intervention.  We discussed the importance of smoking cessation.  The radial sheath was removed and a TR band was placed on the right wrist to achieve patent  hemostasis.  The patient left lab in stable condition.   Physical Exam VS:  BP 134/74 (BP Location: Left Arm, Patient Position: Sitting, Cuff Size: Normal)   Pulse 68   Ht 5' 11 (1.803 m)   Wt 176 lb 3.2 oz (79.9 kg)   SpO2 98%   BMI 24.57 kg/m        Wt Readings from Last 3 Encounters:  10/10/24 176 lb 3.2 oz  (79.9 kg)  06/20/24 157 lb 3.2 oz (71.3 kg)  01/26/23 186 lb (84.4 kg)    GEN: Well nourished, well developed in no acute distress NECK: No JVD; No carotid bruits CARDIAC: S1/S2, RRR, no murmurs, rubs, gallops RESPIRATORY:  Clear to auscultation without rales, wheezing or rhonchi  ABDOMEN: Soft, non-tender, non-distended EXTREMITIES:  No edema; No deformity   ASSESSMENT AND PLAN  CAD, medication management Stable with no anginal symptoms. No indication for ischemic evaluation.  Continue current medication regimen.  Will place orders for BMET, FLP,  and Mag. Heart healthy diet and regular cardiovascular exercise encouraged. Care and ED precautions discussed.   2. Elevated blood pressure reading SBP today is 134. Discussed SBP goal < 130. Recommended to monitor BP at home. Discussed to monitor BP at home at least 2 hours after medications and sitting for 5-10 minutes. Discussed when to contact our office. If SBP is not at goal by next office visit, recommend starting ACE inhibitor/ARB/ARNI. Heart healthy diet and regular cardiovascular exercise encouraged.   3. ICM Stage C, NYHA class I symptoms. EF 40-45%. Euvolemic and well compensated on exam. Will obtain labs as noted above. Continue current medication regimen. Low sodium diet, fluid restriction <2L, and daily weights encouraged. Educated to contact our office for weight gain of 2 lbs overnight or 5 lbs in one week.   4. Does not have a PCP Will provide referral to PCP for further evaluation.   5. Elevated serum creatinine Most recent sCr was 1.32 and most likely r/t dehydration. Recommended to drink plenty of water. Avoid nephrotoxic agents. Plan to arrange labs as noted above. No medication changes at this time.    Dispo: Follow-up with MD/APP in 6 months or sooner if anything changes.   Signed, Almarie Crate, NP   "

## 2024-10-10 NOTE — Patient Instructions (Addendum)
 Medication Instructions:   Continue all current medications.   Labwork:  BMET, FLP, Mg - orders given today Reminder:  Nothing to eat or drink after 12 midnight prior to labs. Office will contact with results via phone, letter or mychart.     Testing/Procedures:  none  Follow-Up:  6 months   Any Other Special Instructions Will Be Listed Below (If Applicable).  You have been referred to:  new primary care provider  BP log [facility]   If you need a refill on your cardiac medications before your next appointment, please call your pharmacy.

## 2024-11-02 ENCOUNTER — Ambulatory Visit (HOSPITAL_BASED_OUTPATIENT_CLINIC_OR_DEPARTMENT_OTHER): Admitting: Family Medicine

## 2024-11-02 ENCOUNTER — Encounter (HOSPITAL_BASED_OUTPATIENT_CLINIC_OR_DEPARTMENT_OTHER): Payer: Self-pay | Admitting: Family Medicine

## 2024-11-02 VITALS — BP 137/77 | HR 88 | Ht 71.0 in | Wt 178.5 lb

## 2024-11-02 DIAGNOSIS — Z7689 Persons encountering health services in other specified circumstances: Secondary | ICD-10-CM | POA: Diagnosis not present

## 2024-11-02 DIAGNOSIS — I25110 Atherosclerotic heart disease of native coronary artery with unstable angina pectoris: Secondary | ICD-10-CM | POA: Diagnosis not present

## 2024-11-02 DIAGNOSIS — Z1211 Encounter for screening for malignant neoplasm of colon: Secondary | ICD-10-CM

## 2024-11-02 DIAGNOSIS — R7989 Other specified abnormal findings of blood chemistry: Secondary | ICD-10-CM | POA: Diagnosis not present

## 2024-11-02 DIAGNOSIS — E785 Hyperlipidemia, unspecified: Secondary | ICD-10-CM | POA: Diagnosis not present

## 2024-11-02 NOTE — Progress Notes (Signed)
 "   New Patient Office Visit  Subjective:   Francisco Dodson Mar 01, 1966 11/02/2024  Chief Complaint  Patient presents with   New Patient (Initial Visit)    Patient is here today to get established with the practice. Denies any main concerns for today's visit.    Discussed the use of AI scribe software for clinical note transcription with the patient, who gave verbal consent to proceed.  History of Present Illness Francisco Dodson is a 59 year old male with coronary artery disease and ischemic cardiomyopathy who presents for establishment of care.  He has a history of coronary artery disease and ischemic cardiomyopathy, with a heart attack in 2021 that required stent placement. In 2023, he experienced a repeat STEMI, necessitating another stent. His ejection fraction was noted to be 45% at that time. He feels better and has gained some weight since his last cardiology visit at the beginning of January.  He is currently managed by [facility] health cardiology in [place].  He has a history of smoking but has since quit. He consumes a lot of soda, which may have impacted his kidney function. His kidney function was slightly elevated at time of lab work conducted by cardiology.  He was recommended to have repeat labs drawn on 10/10/2024, but did not have this completed.  He is on a low sodium diet with fluid restrictions.  He has not had recent lab work done. He expresses fear of needles, stating 'I'm scared of them pains' and prefers not to have blood drawn today due to feeling tired and the long drive to the clinic.  No family history of colon cancer. He has never had a colonoscopy or colon cancer screening.  He has not had a primary care doctor in several years.   The following portions of the patient's history were reviewed and updated as appropriate: past medical history, past surgical history, family history, social history, allergies, medications, and problem list.   Patient Active Problem List   Diagnosis  Date Noted   ST elevation myocardial infarction (STEMI) involving other coronary artery of inferior wall (HCC) 03/23/2022   Hyperlipidemia with target LDL less than 70 04/24/2020   Hypokalemia 04/24/2020   Presence of drug-eluting stent in right coronary artery 04/24/2020   Tobacco use 04/24/2020   Coronary artery disease involving native coronary artery of native heart with unstable angina pectoris (HCC) 04/23/2020   ST elevation myocardial infarction involving right coronary artery (HCC)    ATTENTION DEFICIT DISORDER, HX OF 10/09/2009   Past Medical History:  Diagnosis Date   ATTENTION DEFICIT DISORDER, HX OF    CHEST PAIN, ATYPICAL, HX OF    Coronary artery disease involving native coronary artery of native heart with unstable angina pectoris (HCC) 04/23/2020   Multivessel CAD with luminal irregularity with narrowing up to 50% in the mid LAD; normal ramus immediate vessel; circumflex marginal stent stenosis in a small caliber vessel of 85% ostial and 80% in the midsegment; and 20% proximal RCA narrowing with total occlusion proximal to the PDA takeoff. - DES PCI of RCA, ~ med Rx of LCx-OM   DIZZINESS    ELECTROCARDIOGRAM, ABNORMAL    Hyperlipidemia with target LDL less than 70 04/24/2020   ST elevation myocardial infarction involving right coronary artery (HCC) 04/23/2020   PCI to the RCA with insertion of a 2.5 x 18 mm Resolute Onyx stent postdilated to 2.54 mm with the 100% occlusion reduced to 0% and restoration of TIMI-3 flow.   Past Surgical History:  Procedure Laterality Date   CORONARY STENT INTERVENTION N/A 03/23/2022   Procedure: CORONARY STENT INTERVENTION;  Surgeon: Court Dorn PARAS, MD;  Location: [facility] INVASIVE CV LAB;  Service: Cardiovascular;  Laterality: N/A;   CORONARY/GRAFT ACUTE MI REVASCULARIZATION N/A 04/23/2020   Procedure: Coronary/Graft Acute MI Revascularization;  Surgeon: Burnard Debby LABOR, MD;  Location: [facility] INVASIVE CV LAB;  Service: Cardiovascular; Culprit lesion:  100% distal RCA (PCI-Resolute Onyx DES 2.5 x 18--2.6 mm)   LEFT HEART CATH AND CORONARY ANGIOGRAPHY N/A 04/23/2020   Procedure: LEFT HEART CATH AND CORONARY ANGIOGRAPHY;  Surgeon: Burnard Debby LABOR, MD;  Location: [facility] INVASIVE CV LAB;  Service: Cardiovascular;  INF STEMI: CULPRIT 100% dRCA (DES PCI), 20% proximal RCA noted.  Proximal LAD 20% with mid LAD 50%.  Relatively small caliber OM branch 85 to 80% lesions -> per Dr. Burnard, plan medical management   LEFT HEART CATH AND CORONARY ANGIOGRAPHY N/A 03/23/2022   Procedure: LEFT HEART CATH AND CORONARY ANGIOGRAPHY;  Surgeon: Court Dorn PARAS, MD;  Location: [facility] INVASIVE CV LAB;  Service: Cardiovascular;  Laterality: N/A;   Family History  Problem Relation Age of Onset   Heart attack Mother    Heart disease Mother    Heart attack Father    Heart disease Father    Heart disease Brother    Stroke Brother    Heart attack Brother        CABG x3   Social History   Socioeconomic History   Marital status: Single    Spouse name: Not on file   Number of children: Not on file   Years of education: Not on file   Highest education level: Not on file  Occupational History   Not on file  Tobacco Use   Smoking status: Former    Current packs/day: 1.00    Average packs/day: 1 pack/day for 10.0 years (10.0 ttl pk-yrs)    Types: Cigarettes   Smokeless tobacco: Not on file  Vaping Use   Vaping status: Never Used  Substance and Sexual Activity   Alcohol use: No   Drug use: No   Sexual activity: Never    Birth control/protection: None  Other Topics Concern   Not on file  Social History Narrative   Not on file   Social Drivers of Health   Tobacco Use: Medium Risk (11/02/2024)   Patient History    Smoking Tobacco Use: Former    Smokeless Tobacco Use: Unknown    Passive Exposure: Not on file  Financial Resource Strain: Low Risk (11/02/2024)   Overall Financial Resource Strain (CARDIA)    Difficulty of Paying Living Expenses: Not very hard  Food  Insecurity: No Food Insecurity (11/02/2024)   Epic    Worried About Radiation Protection Practitioner of Food in the Last Year: Never true    Ran Out of Food in the Last Year: Never true  Transportation Needs: No Transportation Needs (11/02/2024)   Epic    Lack of Transportation (Medical): No    Lack of Transportation (Non-Medical): No  Physical Activity: Insufficiently Active (11/02/2024)   Exercise Vital Sign    Days of Exercise per Week: 3 days    Minutes of Exercise per Session: 20 min  Stress: No Stress Concern Present (11/02/2024)   [employer] of Occupational Health - Occupational Stress Questionnaire    Feeling of Stress: Not at all  Social Connections: Socially Isolated (11/02/2024)   Social Connection and Isolation Panel    Frequency of Communication with Friends and Family: Once  a week    Frequency of Social Gatherings with Friends and Family: Never    Attends Religious Services: More than 4 times per year    Active Member of Clubs or Organizations: No    Attends Banker Meetings: Never    Marital Status: Never married  Intimate Partner Violence: Not At Risk (11/02/2024)   Epic    Fear of Current or Ex-Partner: No    Emotionally Abused: No    Physically Abused: No    Sexually Abused: No  Depression (PHQ2-9): Low Risk (11/02/2024)   Depression (PHQ2-9)    PHQ-2 Score: 0  Alcohol Screen: Low Risk (11/02/2024)   Alcohol Screen    Last Alcohol Screening Score (AUDIT): 0  Housing: Low Risk (11/02/2024)   Epic    Unable to Pay for Housing in the Last Year: No    Number of Times Moved in the Last Year: 0    Homeless in the Last Year: No  Utilities: Not At Risk (11/02/2024)   Epic    Threatened with loss of utilities: No  Health Literacy: Adequate Health Literacy (11/02/2024)   B1300 Health Literacy    Frequency of need for help with medical instructions: Never   Outpatient Medications Prior to Visit  Medication Sig Dispense Refill   aspirin  81 MG chewable tablet Chew 1 tablet  (81 mg total) by mouth daily. 90 tablet 0   atorvastatin  (LIPITOR ) 80 MG tablet TAKE 1 TABLET BY MOUTH EVERY DAY 90 tablet 0   ezetimibe  (ZETIA ) 10 MG tablet TAKE 1 TABLET BY MOUTH EVERY DAY 90 tablet 3   metoprolol  tartrate (LOPRESSOR ) 25 MG tablet TAKE 1 TABLET BY MOUTH TWICE A DAY 180 tablet 0   nitroGLYCERIN  (NITROSTAT ) 0.4 MG SL tablet Place 1 tablet (0.4 mg total) under the tongue every 5 (five) minutes as needed for chest pain. 25 tablet 2   ticagrelor  (BRILINTA ) 90 MG TABS tablet Take 1 tablet (90 mg total) by mouth 2 (two) times daily. 180 tablet 3   No facility-administered medications prior to visit.   Allergies[1]  ROS: A complete ROS was performed with pertinent positives/negatives noted in the HPI. The remainder of the ROS are negative.   Objective:   Today's Vitals   11/02/24 1442  BP: 137/77  Pulse: 88  SpO2: 99%  Weight: 178 lb 8 oz (81 kg)  Height: 5' 11 (1.803 m)    GENERAL: Well-appearing, in NAD. Well nourished.  SKIN: Pink, warm and dry.  Head: Normocephalic. NECK: Trachea midline. Full ROM w/o pain or tenderness.  RESPIRATORY: Chest wall symmetrical. Respirations even and non-labored. Breath sounds clear to auscultation bilaterally.  CARDIAC: S1, S2 present, regular rate and rhythm without murmur or gallops. Peripheral pulses 2+ bilaterally.  MSK: Muscle tone and strength appropriate for age.  NEUROLOGIC: No motor or sensory deficits. Steady, even gait. C2-C12 intact.  PSYCH/MENTAL STATUS: Alert, oriented x 3. Cooperative, appropriate mood and affect.        Assessment & Plan:  1. Encounter to establish care with new doctor (Primary) Discussed patient's medical, surgical and family history. Reviewed recent visits with specialty providers as well as previous PCP if applicable. Reviewed role of PCP with patient.    2. Coronary artery disease involving native coronary artery of native heart with unstable angina pectoris (HCC) 3. Hyperlipidemia with  target LDL less than 70 Discussed prevention and management patient emphasized importance of medication adherence.  Recommend patient obtain his lipid panel directed by cardiology today, patient  declined and verbalized understanding of risk.  Recommend that he return for fasting labs and he verbalized understanding.   4. Elevated serum creatinine Serum creatinine slightly elevated 1.32 at time of lab work in October 2025.  Patient was recommended to repeat this and magnesium  on 10/10/2024 but declined.  Patient states he will return to obtain lab work.  We discussed importance of clear fluid intake as well as fluid restrictions due to cardiomyopathy and he verbalized understanding.  5. Screening for colon cancer Discussed Cologuard and colonoscopy recommendations with patient.  He states he will consider and reach out to PCP if he desires to complete screening.  He understands and verbalizes risk of screening and declination.  Patient to reach out to office if new, worrisome, or unresolved symptoms arise or if no improvement in patient's condition. Patient verbalized understanding and is agreeable to treatment plan. All questions answered to patient's satisfaction.    Return in about 3 months (around 01/31/2025) for ANNUAL PHYSICAL (fasting labs at time of visit) .    Francisco Welch Olivia Kinjal Neitzke, FNP     [1]  Allergies Allergen Reactions   Penicillins Other (See Comments)    Childhood allergy    "

## 2024-11-02 NOTE — Patient Instructions (Addendum)
 Please return for fasting blood work.  For fasting, if your blood work is in the morning please do not eat any food after midnight.  You may have water or black coffee prior to your lab work.  Please take all regularly prescribed medications even if you are fasting.  If your blood work is in the afternoon, please fast for at least 5 to 6 hours.  You may continue to drink water and/or black coffee prior to your lab work.  Please take all scheduled medications even if you are fasting.

## 2024-11-24 ENCOUNTER — Ambulatory Visit: Admitting: Nurse Practitioner

## 2025-02-02 ENCOUNTER — Encounter (HOSPITAL_BASED_OUTPATIENT_CLINIC_OR_DEPARTMENT_OTHER): Admitting: Family Medicine
